# Patient Record
Sex: Female | Born: 1995 | Race: Black or African American | Hispanic: No | Marital: Single
Health system: Southern US, Community
[De-identification: ages and names within clinical notes are randomized; demographics above are authoritative.]

## PROBLEM LIST (undated history)

## (undated) DIAGNOSIS — F419 Anxiety disorder, unspecified: Secondary | ICD-10-CM

## (undated) DIAGNOSIS — F329 Major depressive disorder, single episode, unspecified: Secondary | ICD-10-CM

## (undated) DIAGNOSIS — J45909 Unspecified asthma, uncomplicated: Secondary | ICD-10-CM

## (undated) DIAGNOSIS — F32A Depression, unspecified: Secondary | ICD-10-CM

---
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
## 2019-02-17 NOTE — ED Notes (Signed)
ED Triage Note       ED Triage Adult Entered On:  02/17/2019 19:34 EDT    Performed On:  02/17/2019 19:31 EDT by Willeen Cass, RN, Rhys Martini               Triage   Chief Complaint :   sore throat, runny nose, chest discomfort, cough for the past two days   Numeric Rating Pain Scale :   8   [place] Mode of Arrival :   Walking   Infectious Disease Documentation :   Document assessment   Temperature Oral :   38.0 degC(Converted to: 100.4 degF)  (HI)    Heart Rate Monitored :   114 bpm (HI)    Respiratory Rate :   15 br/min   Systolic Blood Pressure :   143 mmHg (HI)    Diastolic Blood Pressure :   92 mmHg (HI)    SpO2 :   100 %   Oxygen Therapy :   Room air   Patient presentation :   None of the above   Chief Complaint or Presentation suggest infection :   Yes   Weight Dosing :   81.7 kg(Converted to: 180 lb 2 oz)    Height :   157.48 cm(Converted to: 5 ft 2 in)    Body Mass Index Dosing :   33 kg/m2   Almon Hercules F - 02/17/2019 19:31 EDT   DCP GENERIC CODE   Tracking Acuity :   3   Tracking Group :   ED [employer]acking [employer]   [place], RN, Rhys Martini - 02/17/2019 19:31 EDT   ED General Section :   Document assessment   Pregnancy Status :   Patient denies   Approximate Last Menstrual Period :   on menstrual   ED Allergies Section :   Document assessment   ED Reason for Visit Section :   Document assessment   ED Quick Assessment :   Patient appears awake, alert, oriented to baseline. Skin warm and dry. Moves all extremities. Respiration even and unlabored. Appears in no apparent distress.   Willeen Cass RN, Evette Georges F - 02/17/2019 19:31 EDT   ID Risk Screen Symptoms   Recent Travel History :   No recent travel   Close Contact with COVID-19  ID :   No   Willeen Cass RN, Evette Georges F - 02/17/2019 19:31 EDT   Allergies   (As Of: 02/17/2019 19:34:44 EDT)   Allergies (Active)   No Known Medication Allergies  Estimated Onset Date:   Unspecified ; Created By:   Marcellina Millin; Reaction Status:   Active ; Category:   Drug ; Substance:   No  Known Medication Allergies ; Type:   Allergy ; Updated By:   Marcellina Millin; Reviewed Date:   12/20/2015 19:25 EST        Psycho-Social   Last 3 mo, thoughts killing self/others :   Patient denies   ADHVIKA, HERSEY - 02/17/2019 19:31 EDT   ED Reason for Visit   (As Of: 02/17/2019 19:34:44 EDT)   Problems(Active)    Anxiety (IMO  :31517 )  Name of Problem:   Anxiety ; Recorder:   Marcellina Millin; Confirmation:   Confirmed ; Classification:   Medical ; Code:   [phone] ; Contributor System:   Dietitian ; Last Updated:   12/20/2015 19:07 EST ; Life Cycle Date:   12/20/2015 ; Life Cycle Status:   Active ; Vocabulary:  IMO        Asthma (SNOMED CT  :937169678 )  Name of Problem:   Asthma ; Recorder:   Marcellina Millin; Confirmation:   Confirmed ; Classification:   Medical ; Code:   [phone] ; Contributor System:   Dietitian ; Last Updated:   12/20/2015 19:07 EST ; Life Cycle Date:   12/20/2015 ; Life Cycle Status:   Active ; Vocabulary:   SNOMED CT          Diagnoses(Active)    UC - Sore Throat  Date:   02/17/2019 ; Diagnosis Type:   Reason For Visit ; Confirmation:   Complaint of ; Clinical Dx:   UC - Sore Throat ; Classification:   Medical ; Clinical Service:   Emergency medicine ; Code:   PNED ; Probability:   0 ; Diagnosis Code:   [id number]

## 2019-02-17 NOTE — ED Notes (Signed)
 ED Patient Summary       ;       [facility]  [address] [place], [place] [address]  [phone]  Discharge Instructions (Patient)  _______________________________________     Name: Wendy Gentry, Wendy Gentry  DOB: 1996-09-26                   MRN: [id number]                   FIN: [id number]  Reason For Visit: UC - Sore Throat;  COLD SX  Final Diagnosis: Upper respiratory tract infection     Visit Date: 02/17/2019 19:01:00  Address: [address] [place] [address]  Phone: [phone]     Primary Care Provider:      Name: PCP,  UNKNOWN      Phone:         Emergency Department Providers:        Primary Physician:   RAYMA HAMILTON [facility] would like to thank you for allowing us  to assist you with your healthcare needs. The following includes patient education materials and information regarding your injury/illness.     Follow-up Instructions: You were treated today on an emergency basis, it may be wise to contact your primary care provider to notify them of your visit today. You may have been referred to your regular doctor or a specialist, please follow up as instructed. If your condition worsens or you can't get in to see the doctor, contact the Emergency Department.              With: Address: When:   Follow up with primary care provider  Within 1 week   Comments:   Drink plenty fluids. Alternate Tylenol  and ibuprofen . No work tomorrow or the next day. Quarantine yourself until symptoms have improved. Follow-up with your doctor if further concern.              Printed Prescriptions:    Patient Education Materials:  Discharge Orders          Discharge Patient 02/17/19 20:20:00 EDT         Comment:      Upper Respiratory Infection, Adult, Easy-to-Read     Upper Respiratory Infection, Adult    Most upper respiratory infections (URIs) are caused by a virus. A URI affects the nose, throat, and upper air passages. The most common type of URI is  often called the common cold.      HOME CARE     Take medicines only as told by your doctor.     Gargle warm saltwater or take cough drops to comfort your throat as told by your doctor.      Use a warm mist humidifier or inhale steam from a shower to increase air moisture. This may make it easier to breathe.      Drink enough fluid to keep your pee (urine) clear or pale yellow.      Eat soups and other clear broths.     Have a healthy diet.      Rest as needed.      Go back to work when your fever is gone or your doctor says it is okay.     ? You may need to stay home longer to avoid giving your URI to others.    ? You can also wear a face mask and  wash your hands often to prevent spread of the virus.     Use your inhaler more if you have asthma.      Do not use any tobacco products, including cigarettes, chewing tobacco, or electronic cigarettes. If you need help quitting, ask your doctor.     GET HELP IF:     You are getting worse, not better.      Your symptoms are not helped by medicine.      You have chills.     You are getting more short of breath.     You have brown or red mucus.     You have yellow or brown discharge from your nose.     You have pain in your face, especially when you bend forward.     You have a fever.     You have puffy (swollen) neck glands.     You have pain while swallowing.     You have white areas in the back of your throat.     GET HELP RIGHT AWAY IF:     You have very bad or constant:    ? Headache.    ? Ear pain.    ? Pain in your forehead, behind your eyes, and over your cheekbones (sinus pain).    ? Chest pain.     You have long-lasting (chronic) lung disease and any of the following:    ? Wheezing.    ? Long-lasting cough.    ? Coughing up blood.    ? A change in your usual mucus.     You have a stiff neck.     You have changes in your:    ? Vision.    ? Hearing.    ? Thinking.    ? Mood.     MAKE SURE YOU:     Understand these instructions.     Will watch your condition.      Will get help right away if you are not doing well or get worse.    This information is not intended to replace advice given to you by your health care provider. Make sure you discuss any questions you have with your health care provider.    Document Released: 05/09/2008 Document Revised: 04/07/2015 Document Reviewed: 02/26/2014  Elsevier Interactive Patient Education ?2016 [employer]vier Inc.         Allergy Info: No Known Medication Allergies     Medication Information:  [facility] provided you with a complete list of medications post discharge, if you have been instructed to stop taking a medication please ensure you also follow up with this information to your Primary Care Physician.  Unless otherwise noted, patient will continue to take medications as prescribed prior to the Emergency Room visit.  Any specific questions regarding your chronic medications and dosages should be discussed with your physician(s) and pharmacist.          No Medications Documented      Medications Administered During Visit:              Medication Dose Route   ibuprofen  800 mg Oral          Major Tests and Procedures:  The following procedures and tests were performed during your Emergency Room visit.  COMMON PROCEDURES%>  COMMON PROCEDURES COMMENTS%>          Laboratory Orders  Name Status Details   Flu A B Completed Nasopharyngeal Swab, Stat, ST -  Stat, 02/17/19 19:40:00 EDT, 02/17/19 19:40:00 EDT, Nurse collect, BURNS-MD,  GLENDIA EWINGS, Print label Y/N               Radiology Orders  No radiology orders were placed.              Patient Care Orders  Name Status Details   Discharge Patient Ordered 02/17/19 20:20:00 EDT   ED Assessment Adult Completed 02/17/19 19:34:45 EDT, 02/17/19 19:34:45 EDT   ED Secondary Triage Completed 02/17/19 19:34:45 EDT, 02/17/19 19:34:45 EDT   ED Triage Adult Completed 02/17/19 19:01:25 EDT, 02/17/19 19:01:25 EDT        ---------------------------------------------------------------------------------------------------------------------  [facility] allows you to manage your health, view your test results, and retrieve your discharge documents from your hospital stay securely and conveniently from your computer.     To begin the enrollment process, visit [email or web address] Click on "Sign up now" under [employer].   Comment:

## 2019-02-17 NOTE — ED Notes (Signed)
ED Triage Note       ED Secondary Triage Entered On:  02/17/2019 20:18 EDT    Performed On:  02/17/2019 20:18 EDT by Willeen Cass, RN, Rhys Martini               General Information   Barriers to Learning :   None evident   ED Home Meds Section :   Document assessment   [facility] ED Fall Risk Section :   Document assessment   ED Advance Directives Section :   Document assessment   EDY, ATCHESON - 02/17/2019 20:18 EDT   (As Of: 02/17/2019 20:18:42 EDT)   Problems(Active)    Anxiety (IMO  :71062 )  Name of Problem:   Anxiety ; Recorder:   Marcellina Millin; Confirmation:   Confirmed ; Classification:   Medical ; Code:   [phone] ; Contributor System:   Dietitian ; Last Updated:   12/20/2015 19:07 EST ; Life Cycle Date:   12/20/2015 ; Life Cycle Status:   Active ; Vocabulary:   IMO        Asthma (SNOMED CT  :462703500 )  Name of Problem:   Asthma ; Recorder:   Marcellina Millin; Confirmation:   Confirmed ; Classification:   Medical ; Code:   [phone] ; Contributor System:   Dietitian ; Last Updated:   12/20/2015 19:07 EST ; Life Cycle Date:   12/20/2015 ; Life Cycle Status:   Active ; Vocabulary:   SNOMED CT          Diagnoses(Active)    UC - Sore Throat  Date:   02/17/2019 ; Diagnosis Type:   Reason For Visit ; Confirmation:   Complaint of ; Clinical Dx:   UC - Sore Throat ; Classification:   Medical ; Clinical Service:   Emergency medicine ; Code:   PNED ; Probability:   0 ; Diagnosis Code:   [id number]             -    Procedure History   (As Of: 02/17/2019 20:18:42 EDT)     Anesthesia Minutes:   0 ; Procedure Name:   None ; Procedure Minutes:   0            [facility] Fall Risk Assessment Tool   Hx of falling last 3 months ED Fall :   No   Patient confused or disoriented ED Fall :   No   Patient intoxicated or sedated ED Fall :   No   Patient impaired gait ED Fall :   No   Use a mobility assistance device ED Fall :   No   Patient altered elimination ED Fall :   No   [facility] ED Fall Score :   0     Willeen Cass RN, Evette Georges F - 02/17/2019 20:18 EDT   ED Advance Directive   Advance Directive :   No   Willeen Cass RN, Evette Georges F - 02/17/2019 20:18 EDT   Med Hx   Medication List   (As Of: 02/17/2019 20:18:42 EDT)   Normal Order    ibuprofen 800 mg Tab  :   ibuprofen 800 mg Tab ; Status:   Completed ; Ordered As Mnemonic:   Motrin ; Simple Display Line:   800 mg, 1 tabs, Oral, Once ; Ordering Provider:   Debera Lat; Catalog Code:   ibuprofen ; Order Dt/Tm:   02/17/2019 19:44:45 EDT

## 2019-02-17 NOTE — Discharge Summary (Signed)
ED Clinical Summary                     [facility]  [address]  [place], [place] [address]  [phone]          PERSON INFORMATION  Name: SARAHJO, POQUETTE Age:  23 Years DOB: 04-Feb-1996   Sex: Female Language: English PCP: PCP,  UNKNOWN   Marital Status: Single Phone: [phone] Med Service: MED-Medicine   MRN: [id number] Acct# [id number] Arrival: 02/17/2019 19:01:00   Visit Reason: UC - Sore Throat;  COLD SX Acuity: 3 LOS: 000 01:28   Address:    [address] [place] [address]   Diagnosis:    Upper respiratory tract infection  Medications:    Medications Administered During Visit:                Medication Dose Route   ibuprofen 800 mg Oral               Allergies      No Known Medication Allergies      Major Tests and Procedures:  The following procedures and tests were performed during your ED visit.  COMMON PROCEDURES%>  COMMON PROCEDURES COMMENTS%>                PROVIDER INFORMATION               Provider Role Assigned Don Broach [facility] ED Provider 02/17/2019 19:36:02    Willeen Cass, RN, Rhys Martini ED Nurse 02/17/2019 19:51:02        Attending Physician:  Debera Lat      Admit Doc  BURNS-MD,  Bartholomew Crews     Consulting Doc       VITALS INFORMATION  Vital Sign Triage Latest   Temp Oral ORAL_1%> ORAL%>   Temp Temporal TEMPORAL_1%> TEMPORAL%>   Temp Intravascular INTRAVASCULAR_1%> INTRAVASCULAR%>   Temp Axillary AXILLARY_1%> AXILLARY%>   Temp Rectal RECTAL_1%> RECTAL%>   02 Sat 100 % 100 %   Respiratory Rate RATE_1%> RATE%>   Peripheral Pulse Rate PULSE RATE_1%> PULSE RATE%>   Apical Heart Rate HEART RATE_1%> HEART RATE%>   Blood Pressure BLOOD PRESSURE_1%>/ BLOOD PRESSURE_1%>92 mmHg BLOOD PRESSURE%> / BLOOD PRESSURE%>92 mmHg                 Immunizations      No Immunizations Documented This Visit          DISCHARGE INFORMATION   Discharge Disposition: H Outpt-Sent Home   Discharge Location:  Home   Discharge Date and Time:  02/17/2019  20:29:20   ED Checkout Date and Time:  02/17/2019 20:29:20     DEPART REASON INCOMPLETE INFORMATION               Depart Action Incomplete Reason   Interactive View/I&O Recently assessed               Problems      Active           Anxiety          Asthma              Smoking Status      Never smoker         PATIENT EDUCATION INFORMATION  Instructions:     Upper Respiratory Infection, Adult, Easy-to-Read     Follow up:  With: Address: When:   Follow up with primary care provider  Within 1 week   Comments:   Drink plenty fluids. Alternate Tylenol and ibuprofen. No work tomorrow or the next day. Quarantine yourself until symptoms have improved. Follow-up with your doctor if further concern.              ED PROVIDER DOCUMENTATION     Patient:   LUCCIA, REINHEIMER             MRN: [id number]            FIN: [id number]               Age:   73 years     Sex:  Female     DOB:  [birth year]   Associated Diagnoses:   Upper respiratory tract infection   Author:   Debera Lat      Basic Information   Time seen: Provider Seen (ST)   ED Provider/Time:    Debera Lat / 02/17/2019 19:36  .   Additional information: Chief Complaint from Nursing Triage Note   Chief Complaint  Chief Complaint: sore throat, runny nose, chest discomfort, cough for the past two days (02/17/19 19:31:00).      History of Present Illness   The patient presents with sore throat and congestion, mild cough, body aches, fever  .  Patient is a 39 old African-American female who presents in the company of her child is also being seen today as a patient with somewhat similar concerns.  Patient is healthy.  She describes 1 day of body aches, congestion, sore throat, nasal congestion, headache, cough and malaise.  She did receive vaccine this year she believes.  No foreign travel however states that she was to [place] several weeks ago.  She has been here in this neighborhood for the last 10 days.  No definite exposure to  anyone presumed positive for coronavirus..        Review of Systems   Constitutional symptoms:  Negative except as documented in HPI.   Skin symptoms:  Negative except as documented in HPI.   Eye symptoms:  Negative except as documented in HPI.   ENMT symptoms:  Negative except as documented in HPI.   Respiratory symptoms:  Negative except as documented in HPI.   Cardiovascular symptoms:  Negative except as documented in HPI.   Gastrointestinal symptoms:  Negative except as documented in HPI.   Genitourinary symptoms   Musculoskeletal symptoms:  Negative except as documented in HPI.   Neurologic symptoms:  Negative except as documented in HPI.   Psychiatric symptoms:  Negative except as documented in HPI.   Endocrine symptoms:  Negative except as documented in HPI.   Hematologic/Lymphatic symptoms:  Negative except as documented in HPI.   Allergy/immunologic symptoms:  Negative except as documented in HPI.      Health Status   Allergies:    No inactive allergies have been recorded..      Past Medical/ Family/ Social History   Surgical history:    None ([id number])., Reviewed as documented in chart.   Family history: Reviewed as documented in chart.   Social history: Reviewed as documented in chart.   Problem list:    Active Problems (2)  Anxiety   Asthma   , per nurse's notes.      Physical Examination               Vital Signs   Vital  Signs   02/17/2019 19:31 EDT Systolic Blood Pressure 143 mmHg  HI    Diastolic Blood Pressure 92 mmHg  HI    Temperature Oral 38.0 degC  HI    Heart Rate Monitored 114 bpm  HI    Respiratory Rate 15 br/min    SpO2 100 %    Measurements   02/17/2019 19:34 EDT Body Mass Index est meas 32.94 kg/m2    Body Mass Index Measured 32.94 kg/m2   02/17/2019 19:31 EDT Height/Length Measured 157.48 cm    Weight Dosing 81.7 kg    Basic Oxygen Information   02/17/2019 19:31 EDT SpO2 100 %    Oxygen Therapy Room air    General:  Alert, no acute distress.    Skin:  Warm, dry, pink.    Head:  Normocephalic,  atraumatic.    Neck:  Supple, trachea midline, no tenderness.    Eye:  Pupils are equal, round and reactive to light, extraocular movements are intact, normal conjunctiva, vision grossly normal.    Ears, nose, mouth and throat:  Tympanic membranes clear, oral mucosa moist.    Cardiovascular:  Regular rate and rhythm, Normal peripheral perfusion.    Respiratory:  Lungs are clear to auscultation, respirations are non-labored.    Chest wall:  No tenderness, No deformity.    Back:  Nontender, Normal range of motion, Normal alignment.    Musculoskeletal:  Normal ROM, normal strength, no tenderness, no swelling, no deformity.    Gastrointestinal:  Soft, Nontender, Non distended, Normal bowel sounds.    Neurological:  Alert and oriented to person, place, time, and situation, No focal neurological deficit observed, normal sensory observed, normal motor observed.    Lymphatics:  No lymphadenopathy.   Psychiatric:  Cooperative, appropriate mood & affect, normal judgment.       Medical Decision Making   Notes:  Remains a stable, awake and alert.  Oxygenating well.  Clearly nontoxic-appearing.  Heart rate is down.  She appears to have a viral upper as disease, likely influenza.  I suspect a false negative study.  Will discharge home in stable condition.  Child has similar symptoms although is much more well-appearing.  Will discharge home in stable condition.  No concern for admission.  Well-appearing patient..      Reexamination/ Reevaluation   Vital signs   Basic Oxygen Information   02/17/2019 19:31 EDT SpO2 100 %    Oxygen Therapy Room air         Impression and Plan   Diagnosis   Upper respiratory tract infection (ICD10-CM J06.9, Discharge, Medical)   Plan   Condition: Improved.    Disposition: Discharged: to home.    Patient was given the following educational materials: Upper Respiratory Infection, Adult, Easy-to-Read.    Follow up with: Follow up with primary care provider Within 1 week Drink plenty fluids.  Alternate  Tylenol and ibuprofen.  No work tomorrow or the next day.  Quarantine yourself until symptoms have improved.  Follow-up with your doctor if further concern..    Counseled: Patient, Regarding diagnosis, Regarding diagnostic results.

## 2019-02-17 NOTE — ED Notes (Signed)
 ED Patient Education Note     Patient Education Materials Follows:  Easy-to-Read     Upper Respiratory Infection, Adult    Most upper respiratory infections (URIs) are caused by a virus. A URI affects the nose, throat, and upper air passages. The most common type of URI is often called "the common cold."      HOME CARE     Take medicines only as told by your doctor.     Gargle warm saltwater or take cough drops to comfort your throat as told by your doctor.      Use a warm mist humidifier or inhale steam from a shower to increase air moisture. This may make it easier to breathe.      Drink enough fluid to keep your pee (urine) clear or pale yellow.      Eat soups and other clear broths.     Have a healthy diet.      Rest as needed.      Go back to work when your fever is gone or your doctor says it is okay.     ? You may need to stay home longer to avoid giving your URI to others.    ? You can also wear a face mask and wash your hands often to prevent spread of the virus.     Use your inhaler more if you have asthma.      Do not use any tobacco products, including cigarettes, chewing tobacco, or electronic cigarettes. If you need help quitting, ask your doctor.     GET HELP IF:     You are getting worse, not better.      Your symptoms are not helped by medicine.      You have chills.     You are getting more short of breath.     You have brown or red mucus.     You have yellow or brown discharge from your nose.     You have pain in your face, especially when you bend forward.     You have a fever.     You have puffy (swollen) neck glands.     You have pain while swallowing.     You have white areas in the back of your throat.     GET HELP RIGHT AWAY IF:     You have very bad or constant:    ? Headache.    ? Ear pain.    ? Pain in your forehead, behind your eyes, and over your cheekbones (sinus pain).    ? Chest pain.     You have long-lasting (chronic) lung disease and any of the following:    ? Wheezing.     ? Long-lasting cough.    ? Coughing up blood.    ? A change in your usual mucus.     You have a stiff neck.     You have changes in your:    ? Vision.    ? Hearing.    ? Thinking.    ? Mood.     MAKE SURE YOU:     Understand these instructions.     Will watch your condition.     Will get help right away if you are not doing well or get worse.    This information is not intended to replace advice given to you by your health care provider. Make sure you discuss any questions you have with your health care  provider.    Document Released: 05/09/2008 Document Revised: 04/07/2015 Document Reviewed: 02/26/2014  Elsevier Interactive Patient Education ?2016 [employer]

## 2019-02-17 NOTE — ED Provider Notes (Signed)
UC - Sore Throat        Patient:   Wendy Gentry, Wendy Gentry             MRN: [id number]            FIN: [id number]               Age:   23 years     Sex:  Female     DOB:  [birth year]   Associated Diagnoses:   Upper respiratory tract infection   Author:   Debera Lat      Basic Information   Time seen: Provider Seen (ST)   ED Provider/Time:    Debera Lat / 02/17/2019 19:36  .   Additional information: Chief Complaint from Nursing Triage Note   Chief Complaint  Chief Complaint: sore throat, runny nose, chest discomfort, cough for the past two days (02/17/19 19:31:00).      History of Present Illness   The patient presents with sore throat and congestion, mild cough, body aches, fever  .  Patient is a 36 old African-American female who presents in the company of her child is also being seen today as a patient with somewhat similar concerns.  Patient is healthy.  She describes 1 day of body aches, congestion, sore throat, nasal congestion, headache, cough and malaise.  She did receive vaccine this year she believes.  No foreign travel however states that she was to [place] several weeks ago.  She has been here in this neighborhood for the last 10 days.  No definite exposure to anyone presumed positive for coronavirus..        Review of Systems   Constitutional symptoms:  Negative except as documented in HPI.   Skin symptoms:  Negative except as documented in HPI.   Eye symptoms:  Negative except as documented in HPI.   ENMT symptoms:  Negative except as documented in HPI.   Respiratory symptoms:  Negative except as documented in HPI.   Cardiovascular symptoms:  Negative except as documented in HPI.   Gastrointestinal symptoms:  Negative except as documented in HPI.   Genitourinary symptoms   Musculoskeletal symptoms:  Negative except as documented in HPI.   Neurologic symptoms:  Negative except as documented in HPI.   Psychiatric symptoms:  Negative except as documented in HPI.   Endocrine symptoms:   Negative except as documented in HPI.   Hematologic/Lymphatic symptoms:  Negative except as documented in HPI.   Allergy/immunologic symptoms:  Negative except as documented in HPI.      Health Status   Allergies:    No inactive allergies have been recorded..      Past Medical/ Family/ Social History   Surgical history:    None ([id number])., Reviewed as documented in chart.   Family history: Reviewed as documented in chart.   Social history: Reviewed as documented in chart.   Problem list:    Active Problems (2)  Anxiety   Asthma   , per nurse's notes.      Physical Examination               Vital Signs   Vital Signs   02/17/2019 19:31 EDT Systolic Blood Pressure 143 mmHg  HI    Diastolic Blood Pressure 92 mmHg  HI    Temperature Oral 38.0 degC  HI    Heart Rate Monitored 114 bpm  HI    Respiratory Rate 15 br/min    SpO2 100 %  Measurements   02/17/2019 19:34 EDT Body Mass Index est meas 32.94 kg/m2    Body Mass Index Measured 32.94 kg/m2   02/17/2019 19:31 EDT Height/Length Measured 157.48 cm    Weight Dosing 81.7 kg    Basic Oxygen Information   02/17/2019 19:31 EDT SpO2 100 %    Oxygen Therapy Room air    General:  Alert, no acute distress.    Skin:  Warm, dry, pink.    Head:  Normocephalic, atraumatic.    Neck:  Supple, trachea midline, no tenderness.    Eye:  Pupils are equal, round and reactive to light, extraocular movements are intact, normal conjunctiva, vision grossly normal.    Ears, nose, mouth and throat:  Tympanic membranes clear, oral mucosa moist.    Cardiovascular:  Regular rate and rhythm, Normal peripheral perfusion.    Respiratory:  Lungs are clear to auscultation, respirations are non-labored.    Chest wall:  No tenderness, No deformity.    Back:  Nontender, Normal range of motion, Normal alignment.    Musculoskeletal:  Normal ROM, normal strength, no tenderness, no swelling, no deformity.    Gastrointestinal:  Soft, Nontender, Non distended, Normal bowel sounds.    Neurological:  Alert and  oriented to person, place, time, and situation, No focal neurological deficit observed, normal sensory observed, normal motor observed.    Lymphatics:  No lymphadenopathy.   Psychiatric:  Cooperative, appropriate mood & affect, normal judgment.       Medical Decision Making   Notes:  Remains a stable, awake and alert.  Oxygenating well.  Clearly nontoxic-appearing.  Heart rate is down.  She appears to have a viral upper as disease, likely influenza.  I suspect a false negative study.  Will discharge home in stable condition.  Child has similar symptoms although is much more well-appearing.  Will discharge home in stable condition.  No concern for admission.  Well-appearing patient..      Reexamination/ Reevaluation   Vital signs   Basic Oxygen Information   02/17/2019 19:31 EDT SpO2 100 %    Oxygen Therapy Room air         Impression and Plan   Diagnosis   Upper respiratory tract infection (ICD10-CM J06.9, Discharge, Medical)   Plan   Condition: Improved.    Disposition: Discharged: to home.    Patient was given the following educational materials: Upper Respiratory Infection, Adult, Easy-to-Read.    Follow up with: Follow up with primary care provider Within 1 week Drink plenty fluids.  Alternate Tylenol and ibuprofen.  No work tomorrow or the next day.  Quarantine yourself until symptoms have improved.  Follow-up with your doctor if further concern..    Counseled: Patient, Regarding diagnosis, Regarding diagnostic results.    Signature Line     Electronically Signed on 02/17/2019 08:20 PM EDT   ________________________________________________   Debera Lat               Modified by: Debera Lat on 02/17/2019 08:20 PM EDT

## 2019-02-18 LAB — RAPID INFLUENZA A/B ANTIGENS
Lot/Kit Number: 705399
RAPID FLU TEST: NEGATIVE
Rapid Influenza A Ag: NEGATIVE
Rapid Influenza B Ag: NEGATIVE

## 2019-06-26 LAB — CBC WITH AUTO DIFFERENTIAL
Absolute Baso #: 0 10*3/uL (ref 0.0–0.2)
Absolute Eos #: 0.1 10*3/uL (ref 0.0–0.5)
Absolute Lymph #: 1.9 10*3/uL (ref 1.0–3.2)
Absolute Mono #: 0.6 10*3/uL (ref 0.3–1.0)
Basophils %: 0.3 % (ref 0.0–2.0)
Eosinophils %: 1 % (ref 0.0–7.0)
Hematocrit: 33.5 % — ABNORMAL LOW (ref 34.0–47.0)
Hemoglobin: 10.8 g/dL — ABNORMAL LOW (ref 11.5–15.7)
Immature Grans (Abs): 0.03 10*3/uL (ref 0.00–0.06)
Immature Granulocytes: 0.4 % (ref 0.1–0.6)
Lymphocytes: 26.7 % (ref 15.0–45.0)
MCH: 24.5 pg — ABNORMAL LOW (ref 27.0–34.5)
MCHC: 32.2 g/dL (ref 32.0–36.0)
MCV: 76 fL — ABNORMAL LOW (ref 81.0–99.0)
MPV: 11 fL (ref 7.2–13.2)
Monocytes: 8.5 % (ref 4.0–12.0)
NRBC Absolute: 0 10*3/uL (ref 0.000–0.012)
NRBC Automated: 0 % (ref 0.0–0.2)
Neutrophils %: 63.1 % (ref 42.0–74.0)
Neutrophils Absolute: 4.5 10*3/uL (ref 1.6–7.3)
Platelets: 329 10*3/uL (ref 140–440)
RBC: 4.41 x10e6/mcL (ref 3.60–5.20)
RDW: 15.6 % (ref 11.0–16.0)
WBC: 7.2 10*3/uL (ref 3.8–10.6)

## 2019-06-26 LAB — BASIC METABOLIC PANEL
Anion Gap: 14 mmol/L (ref 2–17)
BUN: 10 mg/dL (ref 6–20)
CO2: 22 mmol/L (ref 22–29)
Calcium: 9.3 mg/dL (ref 8.6–10.0)
Chloride: 102 mmol/L (ref 98–107)
Creatinine: 0.6 mg/dL (ref 0.5–0.9)
GFR African American: 152 mL/min/{1.73_m2} (ref >=90)
GFR Non-African American: 131 mL/min/{1.73_m2} (ref >=90)
Glucose: 85 mg/dL (ref 70–99)
OSMOLALITY CALCULATED: 274 mOsm/kg (ref 270–287)
Potassium: 3.7 mmol/L (ref 3.5–5.3)
Sodium: 138 mmol/L (ref 135–145)

## 2019-06-26 LAB — WET PREP, GENITAL
RBC, Wet Prep: >5 — AB
Trich, Wet Prep: ABSENT

## 2019-06-26 LAB — POC URINALYSIS, CHEMISTRY
Glucose, UA POC: NEGATIVE mg/dL
Ketones, Urine, POC: NEGATIVE mg/dL
Nitrate, UA POC: NEGATIVE
Protein, Urine, POC: 100 — AB
Specific Gravity, Urine, POC: >=1.03 (ref 1.003–1.035)
UROBILIN U POC: 1 EU/dL
pH, Urine, POC: 5.5 (ref 4.5–8.0)

## 2019-06-26 LAB — ABO/RH: ABO/Rh: O POS

## 2019-06-26 LAB — POC PREGNANCY UR-QUAL: Preg Test, Ur: POSITIVE

## 2019-06-26 LAB — HCG, QUANTITATIVE, PREGNANCY: hCG Quant: 272.6 m[IU]/mL — ABNORMAL HIGH (ref 0.0–5.3)

## 2019-06-26 NOTE — ED Notes (Signed)
ED Triage Note       ED Triage Adult Entered On:  06/26/2019 15:42 EDT    Performed On:  06/26/2019 15:39 EDT by Louretta Shorten H               Triage   Chief Complaint :   pt c/o abd cramping and spotting, she is pregnant but has not seen OB   Numeric Rating Pain Scale :   8   [place] Mode of Arrival :   Private vehicle   Infectious Disease Documentation :   Document assessment   Temperature Oral :   37.3 degC(Converted to: 99.1 degF)    Heart Rate Monitored :   96 bpm   Respiratory Rate :   18 br/min   Systolic Blood Pressure :   621 mmHg   Diastolic Blood Pressure :   83 mmHg   SpO2 :   100 %   Oxygen Therapy :   Room air   Patient presentation :   None of the above   Chief Complaint or Presentation suggest infection :   No   Weight Dosing :   76 kg(Converted to: 167 lb 9 oz)    Height :   157.5 cm(Converted to: 5 ft 2 in)    Body Mass Index Dosing :   31 kg/m2   Elly Modena - 06/26/2019 15:39 EDT   DCP GENERIC CODE   Tracking Acuity :   3   Tracking Group :   ED [id number] Tracking Group   Elly Modena - 06/26/2019 15:39 EDT   ED General Section :   Document assessment   Pregnancy Status :   Unconfirmed Pregnancy   Last Menstrual Period :   05/11/2019 EDT   ED Allergies Section :   Document assessment   ED Reason for Visit Section :   Document assessment   Elly Modena - 06/26/2019 15:39 EDT   ID Risk Screen Symptoms   Recent Travel History :   No recent travel   Close Contact with COVID-19 ID :   No   Last 14 days COVID-19 ID :   No   TB Symptom Screen :   No symptoms   C. diff Symptom/History ID :   Neither of the above   Elly Modena - 06/26/2019 15:39 EDT   Allergies   (As Of: 06/26/2019 15:42:57 EDT)   Allergies (Active)   No Known Medication Allergies  Estimated Onset Date:   Unspecified ; Created By:   Tori Milks; Reaction Status:   Active ; Category:   Drug ; Substance:   No Known Medication Allergies ; Type:   Allergy ; Updated By:   Tori Milks; Reviewed Date:   12/20/2015 19:25 EST         Psycho-Social   Last 3 mo, thoughts killing self/others :   Patient denies   Elly Modena - 06/26/2019 15:39 EDT   ED Reason for Visit   (As Of: 06/26/2019 15:42:57 EDT)   Problems(Active)    Anxiety (IMO  :[id number] )  Name of Problem:   Anxiety ; Recorder:   Tori Milks; Confirmation:   Confirmed ; Classification:   Medical ; Code:   [phone] ; Contributor System:   Conservation officer, nature ; Last Updated:   12/20/2015 19:07 EST ; Life Cycle Date:   12/20/2015 ; Life Cycle Status:   Active ; Vocabulary:   IMO  Asthma (SNOMED CT  :[id number] )  Name of Problem:   Asthma ; Recorder:   Marcellina MillinStandifer,  Marian; Confirmation:   Confirmed ; Classification:   Medical ; Code:   [id number] ; Contributor System:   DietitianowerChart ; Last Updated:   12/20/2015 19:07 EST ; Life Cycle Date:   12/20/2015 ; Life Cycle Status:   Active ; Vocabulary:   SNOMED CT          Diagnoses(Active)    Vaginal bleeding - < [redacted] wks pregnant  Date:   06/26/2019 ; Diagnosis Type:   Reason For Visit ; Confirmation:   Complaint of ; Clinical Dx:   Vaginal bleeding - < [redacted] wks pregnant ; Classification:   Medical ; Clinical Service:   Emergency medicine ; Code:   PNED ; Probability:   0 ; Diagnosis Code:   [id number]

## 2019-06-26 NOTE — ED Notes (Signed)
 ED Results Callback Log     Genital Chlamydia Trachomatis  - - Pos  06/28/2019 11:00      06/29/2019 11:11 Dru, RN, Roberta L) No further action required Dr. Ila Chag ordered Azithromycin 250mg  PO, 4 tablets, 1 x dose. no refills. (RB/V Sandrea Dover, RN).Called pt, left v/m to return call for test results. Needs RX and STD instructions.        06/28/2019 14:34 Dru, RN, Sandrea CROME) Provider Review Required positive chlamydia culture. provider review.        Addendum by Dover RN, Sandrea CROME on June 30, 2019 9:43 EDT               No return call. letter sent.

## 2019-06-26 NOTE — Discharge Summary (Signed)
ED Clinical Summary                     [facility]  [address] [place] [address]  [id number]          PERSON INFORMATION  Name: Wendy Gentry, Wendy Gentry Age:  23 Years DOB: 07-Aug-1996   Sex: Female Language: English PCP: PCP,  UNKNOWN   Marital Status: Single Phone: [phone] Med Service: MED-Medicine   MRN: [id number] Acct# [id number] Arrival: 06/26/2019 15:32:00   Visit Reason: Vaginal bleeding - < [redacted] wks pregnant; POSS MISCARRIAGE Acuity: 3 LOS: 000 02:46   Address:    [place] [address]   Diagnosis:    Threatened miscarriage  Medications:    Medications Administered During Visit:              Allergies      No Known Medication Allergies      Major Tests and Procedures:  The following procedures and tests were performed during your ED visit.  COMMON PROCEDURES%>  COMMON PROCEDURES COMMENTS%>                PROVIDER INFORMATION               Provider Role Assigned Joanie Coddington ED MidLevel 06/26/2019 16:07:50    Britta Mccreedy RN, Garvin Fila ED Nurse 06/26/2019 16:08:50        Attending Physician:  MAFFEO-PA-C,  HEATHER N      Admit Doc  MAFFEO-PA-C,  HEATHER N     Consulting Doc       VITALS INFORMATION  Vital Sign Triage Latest   Temp Oral ORAL_1%> ORAL%>   Temp Temporal TEMPORAL_1%> TEMPORAL%>   Temp Intravascular INTRAVASCULAR_1%> INTRAVASCULAR%>   Temp Axillary AXILLARY_1%> AXILLARY%>   Temp Rectal RECTAL_1%> RECTAL%>   02 Sat 100 % 100 %   Respiratory Rate RATE_1%> RATE%>   Peripheral Pulse Rate PULSE RATE_1%> PULSE RATE%>   Apical Heart Rate HEART RATE_1%> HEART RATE%>   Blood Pressure BLOOD PRESSURE_1%>/ BLOOD PRESSURE_1%>83 mmHg BLOOD PRESSURE%> / BLOOD PRESSURE%>79 mmHg                 Immunizations      No Immunizations Documented This Visit          DISCHARGE INFORMATION   Discharge Disposition: H Outpt-Sent Home   Discharge Location:  Home   Discharge Date and Time:  06/26/2019 18:18:17   ED Checkout Date and Time:  06/26/2019  18:18:17     DEPART REASON INCOMPLETE INFORMATION               Depart Action Incomplete Reason   Interactive View/I&O Recently assessed               Problems      Active           Anxiety          Asthma              Smoking Status      Never smoker         PATIENT EDUCATION INFORMATION  Instructions:     Vaginal Bleeding During Pregnancy, First Trimester, Easy-to-Read; Threatened Miscarriage, Easy-to-Read     Follow up:                   With: Address: When:   Foy [place], Physician - OB/GYN [address] [place] [address]  [phone] Business (1) Within 1 to 2 days   Comments:   Take over-the-counter Tylenol as directed for pain   Follow-up with OB/GYN, call first thing tomorrow for appointment for repeat ultrasound, beta quantitative level in the next few days  Follow up with your primary care doctor  Return to the emergency department for any new, worsening or concerning symptoms                ED PROVIDER DOCUMENTATION     Patient:   Wendy Gentry, Wendy Gentry             MRN: [id number]            FIN: [id number]               Age:   23 years     Sex:  Female     DOB:  11/11/1996   Associated Diagnoses:   Threatened miscarriage   Author:   Vergia AlbertsMAFFEO-PA-C,  HEATHER N      Basic Information   Time seen: Provider Seen (ST)   ED Provider/Time:    MAFFEO-PA-C,  HEATHER N / 06/26/2019 16:07  .   Additional information: Chief Complaint from Nursing Triage Note   Chief Complaint  Chief Complaint: pt c/o abd cramping and spotting, she is pregnant but has not seen OB (06/26/19 15:39:00).      History of Present Illness   Patient is a 23 year old G2, P1 approximately [redacted] weeks gestation by dates female who presents emergency department with complaints of abdominal cramping, vaginal spotting.  Patient states she first noticed the symptoms today.  She states she has not yet established with an OB/GYN in this area because [place].  She denies any nausea, vomiting, fever,  chills.  She has no other complaints at this time..        Review of Systems   Constitutional symptoms:  No fever, no chills.    Eye symptoms:  Vision unchanged, No diplopia,    ENMT symptoms:  No ear pain, no sore throat.    Respiratory symptoms:  No shortness of breath, no cough.    Cardiovascular symptoms:  No chest pain, no palpitations.    Gastrointestinal symptoms:  Abdominal pain, no vomiting, no diarrhea.    Genitourinary symptoms:  Vaginal bleeding, no dysuria, no hematuria.    Neurologic symptoms:  No numbness, no tingling, no focal weakness.              Additional review of systems information: All other systems reviewed and otherwise negative.      Health Status   Allergies:    Allergic Reactions (Selected)  No Known Medication Allergies.   Medications:  (Selected)   .      Past Medical/ Family/ Social History   Medical history:    No active or resolved past medical history items have been selected or recorded., Reviewed as documented in chart.   Surgical history:    None ([id number])., Reviewed as documented in chart.   Family history: Reviewed as documented in chart.   Social history: Reviewed as documented in chart.   Problem list:    Active Problems (2)  Anxiety   Asthma   , per nurse's notes.      Physical Examination               Vital Signs   Vital Signs   06/26/2019 16:29 EDT Systolic Blood Pressure 126 mmHg  Diastolic Blood Pressure 79 mmHg    Heart Rate Monitored 98 bpm    Respiratory Rate 16 br/min    SpO2 100 %   06/26/2019 15:39 EDT Systolic Blood Pressure 128 mmHg    Diastolic Blood Pressure 83 mmHg    Temperature Oral 37.3 degC    Heart Rate Monitored 96 bpm    Respiratory Rate 18 br/min    SpO2 100 %   .   Measurements   06/26/2019 15:42 EDT Body Mass Index est meas 30.64 kg/m2   06/26/2019 15:42 EDT Body Mass Index Measured 30.64 kg/m2   06/26/2019 15:39 EDT Height/Length Measured 157.5 cm    Weight Dosing 76 kg   .   General:  Alert, no acute distress, Not ill-appearing,    Skin:  Warm,  dry.    Head:  Normocephalic.   Neck:  Supple.   Eye:  Normal conjunctiva, vision grossly normal.    Ears, nose, mouth and throat:  Oral mucosa moist.   Cardiovascular:  Regular rate and rhythm, No murmur, Normal peripheral perfusion, No edema.    Respiratory:  Lungs are clear to auscultation, respirations are non-labored, breath sounds are equal, Symmetrical chest wall expansion.    Chest wall:  No deformity.   Back:  Normal range of motion.   Musculoskeletal:  Normal ROM, normal strength.    Gastrointestinal:  Soft, Nontender, Non distended.    Genitourinary:  External genitalia: Normal, Speculum exam: Bleeding, Bimanual exam: Tenderness.    Neurological:  Normal motor observed, normal speech observed.    Psychiatric:  Appropriate mood & affect.      Medical Decision Making   Differential Diagnosis:  Ectopic pregnancy, threatened abortion, spontaneous abortion, incomplete abortion, urinary tract infection.    Documents reviewed:  Emergency department nurses' notes.   Radiology results:  Rad Results (ST)   US Pregnancy Transvaginal  ?  06/26/19 17:26:16  OB ultrasound, transvaginal: 06/26/19    INDICATION:Pregnancy related conditions, unspecified, unspecified trimester.    COMPARISON: None    TECHNIQUE: Transvaginal grayscale and color Doppler images of the uterus and  adnexae.    FINDINGS:  Uterus: 7.4 x 4.4 x 5.0 cm. The uterus is noted to be retroverted. Small amount  of fluid in the cervical canal.  Adnexae: Right ovary 4.0 x 1.8 x 3.2 cm. Left ovary 3.8 x 2.1 x 2.9 cm. Both  ovaries have an unremarkable sonographic appearance.  Fluid noted in the cul-de-sac.    There is a small cystic area in the endometrium which may reflect a small  gestational sac, measuring 0.4 x 0.2 x 0.3 cm. No fetal pole identified. There  is decidual reaction with a gestational sac present.  Gestational sac size: 0.4 x 0.2 x 0.3 cm.  Ultrasound gestational age based on gestational sac size: 5 weeks 0 days  Clinical gestational age: 75  weeks 4 days by LMP    IMPRESSION:  Possible gestational sac identified with no fetal pole visualized. Trace fluid  in the cervical canal. Pelvic free fluid noted in the cul-de-sac. No pelvic  ectopic pregnancy identified. Differential considerations include early IUP  versus failed IUP. Serial beta hCG with followup ultrasound recommended.  EGA by sac size, 5 weeks, 0 days.  ?  Signed By: Precious HawsBARNES-MD, ANDREW F  .   Notes:  Patient in the emergency department for evaluation of abdominal cramping, vaginal spotting in early pregnancy.  Beta quantitative level in the 200s.  Vaginal ultrasound reveals gestational sac smaller than dates with  no fetal pole.  Discussed this at length with the patient.  She was advised to call OB/GYN first thing tomorrow for follow-up appointment with repeat beta quantitative level and ultrasound, and return immediately to the emergency department for any new or worsening symptoms.  Did discuss miscarriage symptoms with the patient and explained to that worsening abdominal pain or vaginal bleeding may be normal but to return for any concerns.  Patient is in agreement with this plan of care. They express understanding and have no further questions at this time. Patient is afebrile, nontoxic appearing and in no acute distress. Discharged to home in stable condition..      Impression and Plan   Diagnosis   Threatened miscarriage (ICD10-CM O20.0, Discharge, Medical)   Plan   Condition: Improved.    Disposition: Discharged: Time  06/26/2019 18:07:00, to home.    Patient was given the following educational materials: Threatened Miscarriage, Easy-to-Read, Vaginal Bleeding During Pregnancy, First Trimester, Easy-to-Read.    Follow up with: DENISE DEVINE-MD, Physician - OB/GYN Within 1 to 2 days Take over-the-counter Tylenol as directed for pain  Follow-up with OB/GYN, call first thing tomorrow for appointment for repeat ultrasound, beta quantitative level in the next few days  Follow up with your  primary care doctor  Return to the emergency department for any new, worsening or concerning symptoms   .    Counseled: I had a detailed discussion with the patient and/or guardian regarding the historical points/exam findings supporting the discharge diagnosis and need for outpatient followup. Discussed the need to return to the ER if symptoms persist/worsen, or for any questions/concerns that arise at home.

## 2019-06-26 NOTE — ED Notes (Signed)
ED Triage Note       ED Secondary Triage Entered On:  06/26/2019 16:20 EDT    Performed On:  06/26/2019 16:20 EDT by Britta Mccreedy, RN, Garvin Fila               General Information   Barriers to Learning :   None evident   ED Home Meds Section :   Document assessment   [facility] ED Fall Risk Section :   Document assessment   ED Advance Directives Section :   Document assessment   ED Palliative Screen :   N/A (prefilled for <65yo)   Bonnita Levan - 06/26/2019 16:20 EDT   (As Of: 06/26/2019 16:20:28 EDT)   Problems(Active)    Anxiety (IMO  :18299 )  Name of Problem:   Anxiety ; Recorder:   Tori Milks; Confirmation:   Confirmed ; Classification:   Medical ; Code:   [phone] ; Contributor System:   Conservation officer, nature ; Last Updated:   12/20/2015 19:07 EST ; Life Cycle Date:   12/20/2015 ; Life Cycle Status:   Active ; Vocabulary:   IMO        Asthma (SNOMED CT  :[id number] )  Name of Problem:   Asthma ; Recorder:   Tori Milks; Confirmation:   Confirmed ; Classification:   Medical ; Code:   [id number] ; Contributor System:   Conservation officer, nature ; Last Updated:   12/20/2015 19:07 EST ; Life Cycle Date:   12/20/2015 ; Life Cycle Status:   Active ; Vocabulary:   SNOMED CT          Diagnoses(Active)    Vaginal bleeding - < [redacted] wks pregnant  Date:   06/26/2019 ; Diagnosis Type:   Reason For Visit ; Confirmation:   Complaint of ; Clinical Dx:   Vaginal bleeding - < [redacted] wks pregnant ; Classification:   Medical ; Clinical Service:   Emergency medicine ; Code:   PNED ; Probability:   0 ; Diagnosis Code:   [id number]             -    Procedure History   (As Of: 06/26/2019 16:20:28 EDT)     Anesthesia Minutes:   0 ; Procedure Name:   None ; Procedure Minutes:   0            [facility] Fall Risk Assessment Tool   Hx of falling last 3 months ED Fall :   No   Patient confused or disoriented ED Fall :   No   Patient intoxicated or sedated ED Fall :   No   Patient impaired gait ED Fall :   No   Use a mobility assistance device ED Fall :    No   Patient altered elimination ED Fall :   No   [facility] ED Fall Score :   0    Bonnita Levan - 06/26/2019 16:20 EDT   ED Advance Directive   Advance Directive :   No   Britta Mccreedy RNGarvin Fila - 06/26/2019 16:20 EDT   Med Hx   Medication List   (As Of: 06/26/2019 16:20:28 EDT)   No Known Home Medications     Bonnita Levan - 06/26/2019 16:20:25

## 2019-06-26 NOTE — ED Notes (Signed)
 ED Patient Summary       ;       [facility] Emergency Department  [address], [place] [address]  [phone]  Discharge Instructions (Patient)  _______________________________________     Name: Wendy Gentry, Wendy Gentry  DOB: Jan 25, 1996                   MRN: [id number]                   FIN: [id number]  Reason For Visit: Vaginal bleeding - < [redacted] wks pregnant; POSS MISCARRIAGE  Final Diagnosis: Threatened miscarriage     Visit Date: 06/26/2019 15:32:00  Address: [address] [place] [address]  Phone: [phone]     Primary Care Provider:      Name: PCP,  UNKNOWN      Phone:         Emergency Department Providers:         Primary Physician:            Shelvy Gwenn Salvage would like to thank you for allowing us  to assist you with your healthcare needs. The following includes patient education materials and information regarding your injury/illness.     Follow-up Instructions: You were treated today on an emergency basis, it may be wise to contact your primary care provider to notify them of your visit today. You may have been referred to your regular doctor or a specialist, please follow up as instructed. If your condition worsens or you can't get in to see the doctor, contact the Emergency Department.              With: Address: When:   DENISE DEVINE-MD, Physician - OB/GYN [address] [place], [place] [address]  [phone] Business (1) Within 1 to 2 days   Comments:   Take over-the-counter Tylenol  as directed for pain   Follow-up with OB/GYN, call first thing tomorrow for appointment for repeat ultrasound, beta quantitative level in the next few days  Follow up with your primary care doctor  Return to the emergency department for any new, worsening or concerning symptoms                Printed Prescriptions:    Patient Education Materials:  Discharge Orders          Discharge Patient 06/26/19 18:08:00 EDT         Comment:      Vaginal Bleeding During Pregnancy, First Trimester,  Easy-to-Read; Threatened Miscarriage, Easy-to-Read     Vaginal Bleeding During Pregnancy, First Trimester    A small amount of bleeding (spotting) from the vagina is common in early pregnancy. Sometimes the bleeding is normal and is not a problem, and sometimes it is a sign of something serious. Be sure to tell your doctor about any bleeding from your vagina right away.      HOME CARE     Watch your condition for any changes.     Follow your doctor's instructions about how active you can be.      If you are on bed rest:    ? You may need to stay in bed and only get up to use the bathroom.    ? You may be allowed to do some activities.    ? If you need help, make plans for someone to help you.     Write down:    ? The number of pads you use each day.    ?  How often you change pads.    ? How soaked (saturated) your pads are.      Do not use tampons.     Do not douche.      Do not have sex or orgasms until your doctor says it is okay.     If you pass any tissue from your vagina, save the tissue so you can show it to your doctor.     Only take medicines as told by your doctor.      Do not take aspirin because it can make you bleed.     Keep all follow-up visits as told by your doctor.    GET HELP IF:     You bleed from your vagina.     You have cramps.     You have labor pains.      You have a fever that does not go away after you take medicine.     GET HELP RIGHT AWAY IF:     You have very bad cramps in your back or belly (abdomen).     You pass large clots or tissue from your vagina.      You bleed more.     You feel light-headed or weak.     You pass out (faint).      You have chills.     You are leaking fluid or have a gush of fluid from your vagina.     You pass out while pooping (having a bowel movement).     MAKE SURE YOU:     Understand these instructions.     Will watch your condition.     Will get help right away if you are not doing well or get worse.    This information is not intended to replace advice  given to you by your health care provider. Make sure you discuss any questions you have with your health care provider.    Document Released: 04/07/2014 Document Reviewed: 04/07/2014  Elsevier Interactive Patient Education ?2016 Elsevier Inc.       Threatened Miscarriage    A threatened miscarriage is when you have vaginal bleeding during your first 20 weeks of pregnancy but the pregnancy has not ended. Your doctor will do tests to make sure you are still pregnant. The cause of the bleeding may not be known. This condition does not mean your pregnancy will end. It does increase the risk of it ending (complete miscarriage).      HOME CARE     Make sure you keep all your doctor visits for prenatal care.     Get plenty of rest.     Do not have sex or use tampons if you have vaginal bleeding.     Do not douche.     Do not smoke or use drugs.     Do not drink alcohol.     Avoid caffeine.    GET HELP IF:     You have light bleeding from your vagina.     You have belly pain or cramping.      You have a fever.    GET HELP RIGHT AWAY IF:     You have heavy bleeding from your vagina.      You have clots of blood coming from your vagina.     You have bad pain or cramps in your low back or belly.     You have fever, chills, and bad belly pain.  MAKE SURE YOU:     Understand these instructions.     Will watch your condition.     Will get help right away if you are not doing well or get worse.    This information is not intended to replace advice given to you by your health care provider. Make sure you discuss any questions you have with your health care provider.    Document Released: 11/03/2008 Document Revised: 11/26/2013 Document Reviewed: 09/17/2013  Elsevier Interactive Patient Education ?2016 Elsevier Inc.         Allergy Info: No Known Medication Allergies     Medication Information:  [facility] ED Physicians provided you with a complete list of medications post discharge, if you have been instructed to stop  taking a medication please ensure you also follow up with this information to your Primary Care Physician.  Unless otherwise noted, patient will continue to take medications as prescribed prior to the Emergency Room visit.  Any specific questions regarding your chronic medications and dosages should be discussed with your physician(s) and pharmacist.          No Known Home Medications      Medications Administered During Visit:       Major Tests and Procedures:  The following procedures and tests were performed during your ED visit.  COMMON PROCEDURES%>  COMMON PROCEDURES COMMENTS%>          Laboratory Orders  Name Status Details   .Preg U POC Completed Urine, RT, RT - Routine, Collected, 06/26/19 16:31:00 EDT, Nurse collect, 06/26/19 16:31:00 US Robinette, RAL POC Login   .UA POC Completed Urine, RT, RT - Routine, Collected, 06/26/19 16:29:00 EDT, Nurse collect, 06/26/19 16:29:00 US Robinette, RAL POC Login   BMP Ordered Blood, Stat, ST - Stat, 06/26/19 16:19:00 EDT, 06/26/19 16:19:00 EDT, Nurse collect, MAFFEO-PA-C,  HEATHER N, Print label Y/N   CBCDIFF Completed Blood, Stat, ST - Stat, 06/26/19 16:19:00 EDT, 06/26/19 16:19:00 EDT, Nurse collect, MAFFEO-PA-C,  HEATHER N, Print label Y/N   CT GC Genital Ordered Genital, Stat, ST - Stat, 06/26/19 16:19:00 EDT, Once, 06/26/19 16:19:00 EDT, Nurse collect   EABORh Completed Blood, Stat, ST - Stat, Collected, 06/26/19 16:23:00 EDT, Nurse collect, 06/26/19 16:23:00 EDT, [id number]   HCG QT Completed Blood, Stat, ST - Stat, 06/26/19 16:19:00 EDT, 06/26/19 16:19:00 EDT, Nurse collect, MAFFEO-PA-C,  HEATHER N, Print label Y/N   KOH Ordered Vaginal, Stat, ST - Stat, 06/26/19 16:19:00 EDT, Once, 06/26/19 16:19:00 EDT, Nurse collect   PABORh Completed Blood, Stat, ST - Stat, 06/26/19 16:19:00 EDT, Nurse collect, Print label Y/N   Wet Prep Ordered Vaginal, Stat, ST - Stat, 06/26/19 16:19:00 EDT, 06/26/19 16:19:00 EDT, Nurse collect, MAFFEO-PA-C,  HEATHER N, Print label Y/N                Radiology Orders  Name Status Details   US  Pregnancy Transvaginal Completed 06/26/19 16:19:00 EDT, STAT 1 hour or less, Reason: Pregnancy related conditions, unspecified, unspecified trimester, Transport Mode: STRETCHER, pp_set_radiology_subspecialty               Patient Care Orders  Name Status Details   Discharge Patient Ordered 06/26/19 18:08:00 EDT   ED Assessment Adult Completed 06/26/19 15:42:58 EDT, 06/26/19 15:42:58 EDT   ED Secondary Triage Completed 06/26/19 15:42:58 EDT, 06/26/19 15:42:58 EDT   ED Triage Adult Completed 06/26/19 15:32:34 EDT, 06/26/19 15:32:34 EDT   POC-Urine Dipstick collect Completed 06/26/19 16:19:00 EDT, Once, 06/26/19 16:19:00 EDT   POC-Urine Pregnancy Test collect Completed 06/26/19  16:19:00 EDT, Once, 06/26/19 16:19:00 EDT   Pelvic Exam Setup Completed 06/26/19 16:19:00 EDT, Once, 06/26/19 16:19:00 EDT       ---------------------------------------------------------------------------------------------------------------------  Florie Shelvy [facility]) encourages you to self-enroll in the [facility] Patient Portal.  [facility] Patient Portal will allow you to manage your personal health information securely from your own electronic device now and in the future.  To begin your Patient Portal enrollment process, please visit [email or web address] Click on "Sign up now" under [facility].  If you find that you need additional assistance on the [facility] Patient Portal or need a copy of your medical records, please call the [facility] Medical Records Office at [phone].  Comment:

## 2019-06-26 NOTE — ED Provider Notes (Signed)
Pregnancy Problem < 20 Weeks *ED        Patient:   Wendy Gentry, Wendy Gentry             MRN: [id number]            FIN: [id number]               Age:   23 years     Sex:  Female     DOB:  01/03/1996   Associated Diagnoses:   Threatened miscarriage   Author:   Lavena Stanford      Basic Information   Time seen: Provider Seen (ST)   ED Provider/Time:    Leightyn Cina-PA-C,  Peniel Biel N / 06/26/2019 16:07  .   Additional information: Chief Complaint from Nursing Triage Note   Chief Complaint  Chief Complaint: pt c/o abd cramping and spotting, she is pregnant but has not seen OB (06/26/19 15:39:00).      History of Present Illness   Patient is a 23 year old G2, P1 approximately [redacted] weeks gestation by dates female who presents emergency department with complaints of abdominal cramping, vaginal spotting.  Patient states she first noticed the symptoms today.  She states she has not yet established with an OB/GYN in this area because she moved here from [place] approximately 5 months ago.  She denies any nausea, vomiting, fever, chills.  She has no other complaints at this time..        Review of Systems   Constitutional symptoms:  No fever, no chills.    Eye symptoms:  Vision unchanged, No diplopia,    ENMT symptoms:  No ear pain, no sore throat.    Respiratory symptoms:  No shortness of breath, no cough.    Cardiovascular symptoms:  No chest pain, no palpitations.    Gastrointestinal symptoms:  Abdominal pain, no vomiting, no diarrhea.    Genitourinary symptoms:  Vaginal bleeding, no dysuria, no hematuria.    Neurologic symptoms:  No numbness, no tingling, no focal weakness.              Additional review of systems information: All other systems reviewed and otherwise negative.      Health Status   Allergies:    Allergic Reactions (Selected)  No Known Medication Allergies.   Medications:  (Selected)   .      Past Medical/ Family/ Social History   Medical history:    No active or resolved past medical history items have been  selected or recorded., Reviewed as documented in chart.   Surgical history:    None ([id number])., Reviewed as documented in chart.   Family history: Reviewed as documented in chart.   Social history: Reviewed as documented in chart.   Problem list:    Active Problems (2)  Anxiety   Asthma   , per nurse's notes.      Physical Examination               Vital Signs   Vital Signs   6/38/7564 33:29 EDT Systolic Blood Pressure 518 mmHg    Diastolic Blood Pressure 79 mmHg    Heart Rate Monitored 98 bpm    Respiratory Rate 16 br/min    SpO2 100 %   8/41/6606 30:16 EDT Systolic Blood Pressure 010 mmHg    Diastolic Blood Pressure 83 mmHg    Temperature Oral 37.3 degC    Heart Rate Monitored 96 bpm    Respiratory Rate 18 br/min    SpO2 100 %   .  Measurements   06/26/2019 15:42 EDT Body Mass Index est meas 30.64 kg/m2   06/26/2019 15:42 EDT Body Mass Index Measured 30.64 kg/m2   06/26/2019 15:39 EDT Height/Length Measured 157.5 cm    Weight Dosing 76 kg   .   General:  Alert, no acute distress, Not ill-appearing,    Skin:  Warm, dry.    Head:  Normocephalic.   Neck:  Supple.   Eye:  Normal conjunctiva, vision grossly normal.    Ears, nose, mouth and throat:  Oral mucosa moist.   Cardiovascular:  Regular rate and rhythm, No murmur, Normal peripheral perfusion, No edema.    Respiratory:  Lungs are clear to auscultation, respirations are non-labored, breath sounds are equal, Symmetrical chest wall expansion.    Chest wall:  No deformity.   Back:  Normal range of motion.   Musculoskeletal:  Normal ROM, normal strength.    Gastrointestinal:  Soft, Nontender, Non distended.    Genitourinary:  External genitalia: Normal, Speculum exam: Bleeding, Bimanual exam: Tenderness.    Neurological:  Normal motor observed, normal speech observed.    Psychiatric:  Appropriate mood & affect.      Medical Decision Making   Differential Diagnosis:  Ectopic pregnancy, threatened abortion, spontaneous abortion, incomplete abortion, urinary tract  infection.    Documents reviewed:  Emergency department nurses' notes.   Radiology results:  Rad Results (ST)   US Pregnancy Transvaginal  ?  06/26/19 17:26:16  OB ultrasound, transvaginal: 06/26/19    INDICATION:Pregnancy related conditions, unspecified, unspecified trimester.    COMPARISON: None    TECHNIQUE: Transvaginal grayscale and color Doppler images of the uterus and  adnexae.    FINDINGS:  Uterus: 7.4 x 4.4 x 5.0 cm. The uterus is noted to be retroverted. Small amount  of fluid in the cervical canal.  Adnexae: Right ovary 4.0 x 1.8 x 3.2 cm. Left ovary 3.8 x 2.1 x 2.9 cm. Both  ovaries have an unremarkable sonographic appearance.  Fluid noted in the cul-de-sac.    There is a small cystic area in the endometrium which may reflect a small  gestational sac, measuring 0.4 x 0.2 x 0.3 cm. No fetal pole identified. There  is decidual reaction with a gestational sac present.  Gestational sac size: 0.4 x 0.2 x 0.3 cm.  Ultrasound gestational age based on gestational sac size: 5 weeks 0 days  Clinical gestational age: 18 weeks 4 days by LMP    IMPRESSION:  Possible gestational sac identified with no fetal pole visualized. Trace fluid  in the cervical canal. Pelvic free fluid noted in the cul-de-sac. No pelvic  ectopic pregnancy identified. Differential considerations include early IUP  versus failed IUP. Serial beta hCG with followup ultrasound recommended.  EGA by sac size, 5 weeks, 0 days.  ?  Signed By: Precious HawsBARNES-MD, ANDREW F  .   Notes:  Patient in the emergency department for evaluation of abdominal cramping, vaginal spotting in early pregnancy.  Beta quantitative level in the 200s.  Vaginal ultrasound reveals gestational sac smaller than dates with no fetal pole.  Discussed this at length with the patient.  She was advised to call OB/GYN first thing tomorrow for follow-up appointment with repeat beta quantitative level and ultrasound, and return immediately to the emergency department for any new or worsening  symptoms.  Did discuss miscarriage symptoms with the patient and explained to that worsening abdominal pain or vaginal bleeding may be normal but to return for any concerns.  Patient is in agreement with  this plan of care. They express understanding and have no further questions at this time. Patient is afebrile, nontoxic appearing and in no acute distress. Discharged to home in stable condition..      Impression and Plan   Diagnosis   Threatened miscarriage (ICD10-CM O20.0, Discharge, Medical)   Plan   Condition: Improved.    Disposition: Discharged: Time  06/26/2019 18:07:00, to home.    Patient was given the following educational materials: Threatened Miscarriage, Easy-to-Read, Vaginal Bleeding During Pregnancy, First Trimester, Easy-to-Read.    Follow up with: DENISE DEVINE-MD, Physician - OB/GYN Within 1 to 2 days Take over-the-counter Tylenol as directed for pain  Follow-up with OB/GYN, call first thing tomorrow for appointment for repeat ultrasound, beta quantitative level in the next few days  Follow up with your primary care doctor  Return to the emergency department for any new, worsening or concerning symptoms   .    Counseled: I had a detailed discussion with the patient and/or guardian regarding the historical points/exam findings supporting the discharge diagnosis and need for outpatient followup. Discussed the need to return to the ER if symptoms persist/worsen, or for any questions/concerns that arise at home.    Signature Line     Electronically Signed on 06/26/2019 06:08 PM EDT   ________________________________________________   Vergia AlbertsMAFFEO-PA-C,  Lanecia Sliva N      Electronically Signed on 06/27/2019 10:59 PM EDT   ________________________________________________   PARASCHOS-MD,  THEODORE            Modified by: Maui Ahart-PA-C,  Gae Bihl N on 06/26/2019 05:44 PM EDT      Modified by: Markeia Harkless-PA-C,  Tarius Stangelo N on 06/26/2019 06:08 PM EDT

## 2019-06-26 NOTE — ED Notes (Signed)
ED Patient Education Note     Patient Education Materials Follows:  Obstetrics and Gynecology     Vaginal Bleeding During Pregnancy, First Trimester    A small amount of bleeding (spotting) from the vagina is common in early pregnancy. Sometimes the bleeding is normal and is not a problem, and sometimes it is a sign of something serious. Be sure to tell your doctor about any bleeding from your vagina right away.      HOME CARE     Watch your condition for any changes.     Follow your doctor's instructions about how active you can be.      If you are on bed rest:    ? You may need to stay in bed and only get up to use the bathroom.    ? You may be allowed to do some activities.    ? If you need help, make plans for someone to help you.     Write down:    ? The number of pads you use each day.    ? How often you change pads.    ? How soaked (saturated) your pads are.      Do not use tampons.     Do not douche.      Do not have sex or orgasms until your doctor says it is okay.     If you pass any tissue from your vagina, save the tissue so you can show it to your doctor.     Only take medicines as told by your doctor.      Do not take aspirin because it can make you bleed.     Keep all follow-up visits as told by your doctor.    GET HELP IF:     You bleed from your vagina.     You have cramps.     You have labor pains.      You have a fever that does not go away after you take medicine.     GET HELP RIGHT AWAY IF:     You have very bad cramps in your back or belly (abdomen).     You pass large clots or tissue from your vagina.      You bleed more.     You feel light-headed or weak.     You pass out (faint).      You have chills.     You are leaking fluid or have a gush of fluid from your vagina.     You pass out while pooping (having a bowel movement).     MAKE SURE YOU:     Understand these instructions.     Will watch your condition.     Will get help right away if you are not doing well or get worse.    This  information is not intended to replace advice given to you by your health care provider. Make sure you discuss any questions you have with your health care provider.    Document Released: 04/07/2014 Document Reviewed: 04/07/2014  Elsevier Interactive Patient Education ?2016 [employer]         Threatened Miscarriage    A threatened miscarriage is when you have vaginal bleeding during your first 20 weeks of pregnancy but the pregnancy has not ended. Your doctor will do tests to make sure you are still pregnant. The cause of the bleeding may not be known. This condition does not mean your pregnancy will  end. It does increase the risk of it ending (complete miscarriage).      HOME CARE     Make sure you keep all your doctor visits for prenatal care.     Get plenty of rest.     Do not have sex or use tampons if you have vaginal bleeding.     Do not douche.     Do not smoke or use drugs.     Do not drink alcohol.     Avoid caffeine.    GET HELP IF:     You have light bleeding from your vagina.     You have belly pain or cramping.      You have a fever.    GET HELP RIGHT AWAY IF:     You have heavy bleeding from your vagina.      You have clots of blood coming from your vagina.     You have bad pain or cramps in your low back or belly.     You have fever, chills, and bad belly pain.    MAKE SURE YOU:     Understand these instructions.     Will watch your condition.     Will get help right away if you are not doing well or get worse.    This information is not intended to replace advice given to you by your health care provider. Make sure you discuss any questions you have with your health care provider.    Document Released: 11/03/2008 Document Revised: 11/26/2013 Document Reviewed: 09/17/2013  Elsevier Interactive Patient Education ?2016 Elsevier Inc.

## 2019-06-27 LAB — SMEAR FUNGUS: FINAL REPORT: NONE SEEN

## 2019-06-28 LAB — C.TRACHOMATIS N.GONORRHOEAE DNA
Chlamydia trachomatis, NAA: POSITIVE — AB
Neisseria Gonorrhoeae, NAA: NEGATIVE

## 2019-10-23 LAB — URINALYSIS W/ RFLX MICROSCOPIC
Amorphous, UA: NONE SEEN /HPF
Bilirubin Urine: NEGATIVE
Blood, Urine: NEGATIVE
Glucose, UA: NEGATIVE mg/dL
MUCUS, URINE: NONE SEEN /LPF
Nitrite, Urine: NEGATIVE
Protein, UA: 30 — AB
RBC, UA: NONE SEEN /HPF (ref 0–2)
Specific Gravity, UA: 1.026 (ref 1.003–1.035)
Urobilinogen, Urine: 1 EU/dL
pH, UA: 7.5 (ref 4.5–8.0)

## 2019-10-23 LAB — BASIC METABOLIC PANEL
Anion Gap: 9 mmol/L (ref 2–17)
BUN: 6 mg/dL (ref 6–20)
CO2: 21 mmol/L — ABNORMAL LOW (ref 22–29)
Calcium: 9 mg/dL (ref 8.6–10.0)
Chloride: 101 mmol/L (ref 98–107)
Creatinine: 0.5 mg/dL (ref 0.5–0.9)
GFR African American: 160 mL/min/{1.73_m2} (ref >=90)
GFR Non-African American: 138 mL/min/1.73m^2 (ref >=90)
Glucose: 83 mg/dL (ref 70–99)
Osmolaliy Calculated: 259 mosm/kg — ABNORMAL LOW (ref 270–287)
Potassium: 3.6 mmol/L (ref 3.5–5.3)
Sodium: 131 mmol/L — ABNORMAL LOW (ref 135–145)

## 2019-10-23 LAB — CBC WITH AUTO DIFFERENTIAL
Basophils %: 0.2 % (ref 0.0–2.0)
Basophils Absolute: 0 10*3/uL (ref 0.0–0.2)
Eosinophils %: 0.5 % (ref 0.0–7.0)
Eosinophils Absolute: 0 10*3/uL (ref 0.0–0.5)
Hematocrit: 32.1 % — ABNORMAL LOW (ref 34.0–47.0)
Hemoglobin: 10.5 g/dL — ABNORMAL LOW (ref 11.5–15.7)
Immature Grans (Abs): 0.03 10*3/uL (ref 0.00–0.06)
Immature Granulocytes %: 0.4 % (ref 0.1–0.6)
Lymphocytes Absolute: 1.5 10*3/uL (ref 1.0–3.2)
Lymphocytes: 18.3 % (ref 15.0–45.0)
MCH: 24.4 pg — ABNORMAL LOW (ref 27.0–34.5)
MCHC: 32.7 g/dL (ref 32.0–36.0)
MCV: 74.7 fL — ABNORMAL LOW (ref 81.0–99.0)
MPV: 10.8 fL (ref 7.2–13.2)
Monocytes %: 9.7 % (ref 4.0–12.0)
Monocytes Absolute: 0.8 10*3/uL (ref 0.3–1.0)
NRBC Absolute: 0 10*3/uL (ref 0.000–0.012)
NRBC Automated: 0 % (ref 0.0–0.2)
Neutrophils %: 70.9 % (ref 42.0–74.0)
Neutrophils Absolute: 5.9 10*3/uL (ref 1.6–7.3)
Platelets: 306 10*3/uL (ref 140–440)
RBC: 4.3 x10e6/mcL (ref 3.60–5.20)
RDW: 15.1 % (ref 11.0–16.0)
WBC: 8.4 10*3/uL (ref 3.8–10.6)

## 2019-10-23 LAB — HCG, QUANTITATIVE, PREGNANCY: hCG Quant: 67559 m[IU]/mL — ABNORMAL HIGH (ref 0.0–5.3)

## 2019-10-23 LAB — POC PREGNANCY UR-QUAL: Preg Test, Ur: POSITIVE — AB

## 2019-10-23 NOTE — ED Notes (Signed)
ED Patient Education Note     Patient Education Materials Follows:  Easy-to-Read     General Headache Without Cause    A headache is pain or discomfort felt around the head or neck area. There are many causes and types of headaches. In some cases, the cause may not be found.       HOME CARE    Managing Pain     Take over-the-counter and prescription medicines only as told by your doctor.      Lie down in a dark, quiet room when you have a headache.      If directed, apply ice to the head and neck area:     ? Put ice in a plastic bag.     ? Place a towel between your skin and the bag.     ? Leave the ice on for 20 minutes, 2?3 times per day.      Use a heating pad or hot shower to apply heat to the head and neck area as told by your doctor.      Keep lights dim if bright lights bother you or make your headaches worse.     Eating and Drinking     Eat meals on a regular schedule.     Lessen how much alcohol you drink.      Lessen how much caffeine you drink, or stop drinking caffeine.     General Instructions     Keep all follow-up visits as told by your doctor. This is important.     Keep a journal to find out if certain things bring on headaches. For example, write down:    ? What you eat and drink.     ? How much sleep you get.     ? Any change to your diet or medicines.      Relax by getting a massage or doing other relaxing activities.      Lessen stress.      Sit up straight. Do not tighten (tense) your muscles.      Do not use tobacco products. This includes cigarettes, chewing tobacco, or e-cigarettes. If you need help quitting, ask your doctor.      Exercise regularly as told by your doctor.      Get enough sleep. This often means 7?9 hours of sleep.    GET HELP IF:     Your symptoms are not helped by medicine.     You have a headache that feels different than the other headaches.      You feel sick to your stomach (nauseous) or you throw up (vomit).      You have a fever.     GET HELP RIGHT AWAY IF:     Your  headache becomes really bad.     You keep throwing up.     You have a stiff neck.     You have trouble seeing.     You have trouble speaking.      You have pain in the eye or ear.      Your muscles are weak or you lose muscle control.      You lose your balance or have trouble walking.      You feel like you will pass out (faint) or you pass out.     You have confusion.    This information is not intended to replace advice given to you by your health care provider. Make sure you discuss   any questions you have with your health care provider.    Document Released: 08/30/2008 Document Revised: 08/12/2015 Document Reviewed: 03/16/2015  Elsevier Interactive Patient Education ?2016 [employer].      Obstetrics and Gynecology     Pregnancy and Urinary Tract Infection    A urinary tract infection (UTI) is a bacterial infection of the urinary tract. Infection of the urinary tract can include the ureters, kidneys (pyelonephritis), bladder (cystitis), and urethra (urethritis). All pregnant women should be screened for bacteria in the urinary tract. Identifying and treating a UTI will decrease the risk of preterm labor and developing more serious infections in both the mother and baby.    CAUSES    Bacteria germs cause almost all UTIs.     RISK FACTORS    Many factors can increase your chances of getting a UTI during pregnancy. These include:     Having a short urethra.     Poor toilet and hygiene habits.      Sexual intercourse.     Blockage of urine along the urinary tract.     Problems with the pelvic muscles or nerves.     Diabetes.     Obesity.     Bladder problems after having several children.     Previous history of UTI.    SIGNS AND SYMPTOMS     Pain, burning, or a stinging feeling when urinating.     Suddenly feeling the need to urinate right away (urgency).     Loss of bladder control (urinary incontinence).     Frequent urination, more than is common with pregnancy.     Lower abdominal or back discomfort.      Cloudy urine.     Blood in the urine (hematuria).     Fever.?    When the kidneys are infected, the symptoms may be:     Back pain.     Flank pain on the right side more so than the left.     Fever.     Chills.      Nausea.      Vomiting.    DIAGNOSIS    A urinary tract infection is usually diagnosed through urine tests. Additional tests and procedures are sometimes done. These may include:     Ultrasound exam of the kidneys, ureters, bladder, and urethra.      Looking in the bladder with a lighted tube (cystoscopy).    TREATMENT    Typically, UTIs can be treated with antibiotic medicines.     HOME CARE INSTRUCTIONS     Only take over-the-counter or prescription medicines as directed by your health care provider. If you were prescribed antibiotics, take them as directed. Finish them even if you start to feel better.      Drink enough fluids to keep your urine clear or pale yellow.     Do not have sexual intercourse until the infection is gone and your health care provider says it is okay.     Make sure you are tested for UTIs throughout your pregnancy. These infections often come back.?    Preventing a UTI in the Future     Practice good toilet habits. Always wipe from front to back. Use the tissue only once.     Do not hold your urine. Empty your bladder as soon as possible when the urge comes.     Do not douche or use deodorant sprays.     Wash with soap and warm water around  the genital area and the anus.     Empty your bladder before and after sexual intercourse.     Wear underwear with a cotton crotch.     Avoid caffeine and carbonated drinks. They can irritate the bladder.     Drink cranberry juice or take cranberry pills. This may decrease the risk of getting a UTI.     Do not drink alcohol.     Keep all your appointments and tests as scheduled.?    SEEK MEDICAL CARE IF:     Your symptoms get worse.     You are still having fevers 2 or more days after treatment begins.     You have a rash.     You feel that  you are having problems with medicines prescribed.     You have abnormal vaginal discharge.    SEEK IMMEDIATE MEDICAL CARE IF:     You have back or flank pain.     You have chills.     You have blood in your urine.     You have nausea and vomiting.     You have contractions of your uterus.     You have a gush of fluid from the vagina.    MAKE SURE YOU:     Understand these instructions. ?     Will watch your condition. ?     Will get help right away if you are not doing well or get worse. ?    This information is not intended to replace advice given to you by your health care provider. Make sure you discuss any questions you have with your health care provider.    Document Released: 03/18/2011 Document Revised: 09/11/2013 Document Reviewed: 10/12/2015  Elsevier Interactive Patient Education ?2016 Elsevier Inc.

## 2019-10-23 NOTE — ED Notes (Signed)
ED Triage Note       ED Secondary Triage Entered On:  10/23/2019 15:25 EST    Performed On:  10/23/2019 15:25 EST by Julien Girt, RN, Clyde Canterbury               General Information   Barriers to Learning :   None evident   ED Home Meds Section :   Document assessment   [facility] ED Fall Risk Section :   Document assessment   ED Advance Directives Section :   Document assessment   ED Palliative Screen :   N/A (prefilled for <65yo)   Julien Girt, RN, Darcy A - 10/23/2019 15:25 EST   (As Of: 10/23/2019 15:25:54 EST)   Problems(Active)    Anxiety (IMO  :75916 )  Name of Problem:   Anxiety ; Recorder:   Marcellina Millin; Confirmation:   Confirmed ; Classification:   Medical ; Code:   [phone] ; Contributor System:   Dietitian ; Last Updated:   12/20/2015 19:07 EST ; Life Cycle Date:   12/20/2015 ; Life Cycle Status:   Active ; Vocabulary:   IMO        Asthma (SNOMED CT  :599357017 )  Name of Problem:   Asthma ; Recorder:   Marcellina Millin; Confirmation:   Confirmed ; Classification:   Medical ; Code:   [id number] ; Contributor System:   Dietitian ; Last Updated:   12/20/2015 19:07 EST ; Life Cycle Date:   12/20/2015 ; Life Cycle Status:   Active ; Vocabulary:   SNOMED CT        Preeclampsia (SNOMED CT  :2330076226 )  Name of Problem:   Preeclampsia ; Recorder:   Julien Girt, RN, Programme researcher, broadcasting/film/video A; Confirmation:   Confirmed ; Classification:   Patient Stated ; Code:   [phone] ; Contributor System:   Dietitian ; Last Updated:   10/23/2019 15:25 EST ; Life Cycle Date:   10/23/2019 ; Life Cycle Status:   Active ; Vocabulary:   SNOMED CT          Diagnoses(Active)    Headache  Date:   10/23/2019 ; Diagnosis Type:   Reason For Visit ; Confirmation:   Complaint of ; Clinical Dx:   Headache ; Classification:   Medical ; Clinical Service:   Emergency medicine ; Code:   PNED ; Probability:   0 ; Diagnosis Code:   [id number]             -    Procedure History   (As Of: 10/23/2019 15:25:54 EST)     Anesthesia Minutes:   0 ;  Procedure Name:   None ; Procedure Minutes:   0            [facility] Fall Risk Assessment Tool   Hx of falling last 3 months ED Fall :   No   Patient confused or disoriented ED Fall :   No   Patient intoxicated or sedated ED Fall :   No   Patient impaired gait ED Fall :   No   Use a mobility assistance device ED Fall :   No   Patient altered elimination ED Fall :   No   [facility] ED Fall Score :   0    Julien Girt RN, Jewel Baize A - 10/23/2019 15:25 EST   ED Advance Directive   Advance Directive :   No   Julien Girt, RN, Jewel Baize A - 10/23/2019 15:25 EST   Med Hx   Medication List   (  As Of: 10/23/2019 15:25:54 EST)   No Known Home Medications     Dara Lords, RN, Salomon Fick A - 10/23/2019 15:25:51

## 2019-10-23 NOTE — ED Notes (Signed)
ED Patient Summary       ;       [facility]  [address], [place], [place] [address]  [phone]  Discharge Instructions (Patient)  _______________________________________     Name: Wendy Gentry, Wendy Gentry  DOB:  13-May-1996                   MRN: [id number]                   FIN: [id number]  Reason For Visit: Abdominal pain - Pregnancy; Headache; PREGNANT [redacted] WEEKS/WEAK/ABD PAIN/NAUSEA  Final Diagnosis: Head ache; Loss of appetite; UTI in pregnancy     Visit Date: 10/23/2019 15:13:00  Address: [address] [place] [address]  Phone: [phone]     Emergency Department Providers:         Primary Physician:            [facility] would like to thank you for allowing [place] to assist you with your healthcare needs. The following includes patient education materials and information regarding your injury/illness.     Follow-up Instructions:  You were seen today on an emergency basis. Please contact your primary care doctor for a follow up appointment. If you received a referral to a specialist doctor, it is important you follow-up as instructed.    It is important that you call your follow-up doctor to schedule and confirm the location of your next appointment. Your doctor may practice at multiple locations. The office location of your follow-up appointment may be different to the one written on your discharge instructions.    If you do not have a primary care doctor, please call [phone] for help in finding a [facility] Provider. For help in finding a specialist doctor, please call [phone].    The [facility] "Ask a Nurse" line in staffed by Registered Nurses and is a free service to the community. We are available Monday - Friday from 8am to 5pm to answer your questions about your health. Please call [phone].    If your condition gets worse before your follow-up with your primary care doctor or specialist, please return to  the Emergency Department.        Follow Up Appointments:  Primary Care Provider:      Name: PCP,  NONE      Phone:                  With: Address: When:   [name], Physician - OB/GYN [address], [address] [place], [place] [address]  [phone] Business (1) Within 1 week   Comments:   Start taking antibiotic, Keflex, for urinary tract infection.   Follow-up with OB/GYN              Printed Prescriptions:    Patient Education Materials:  Discharge Orders          Discharge Patient 10/23/19 17:14:00 EST         Comment:      Pregnancy and Urinary Tract Infection; General Headache Without Cause, Easy-to-Read     Pregnancy and Urinary Tract Infection    A urinary tract infection (UTI) is a bacterial infection of the urinary tract. Infection of the urinary tract can include the ureters, kidneys (pyelonephritis), bladder (cystitis), and urethra (urethritis). All pregnant women should be screened for bacteria in the urinary tract. Identifying and treating a UTI will decrease the  risk of preterm labor and developing more serious infections in both the mother and baby.    CAUSES    Bacteria germs cause almost all UTIs.     RISK FACTORS    Many factors can increase your chances of getting a UTI during pregnancy. These include:     Having a short urethra.     Poor toilet and hygiene habits.      Sexual intercourse.     Blockage of urine along the urinary tract.     Problems with the pelvic muscles or nerves.     Diabetes.     Obesity.     Bladder problems after having several children.     Previous history of UTI.    SIGNS AND SYMPTOMS     Pain, burning, or a stinging feeling when urinating.     Suddenly feeling the need to urinate right away (urgency).     Loss of bladder control (urinary incontinence).     Frequent urination, more than is common with pregnancy.     Lower abdominal or back discomfort.     Cloudy urine.     Blood in the urine (hematuria).     Fever.?    When the kidneys are infected, the  symptoms may be:     Back pain.     Flank pain on the right side more so than the left.     Fever.     Chills.      Nausea.      Vomiting.    DIAGNOSIS    A urinary tract infection is usually diagnosed through urine tests. Additional tests and procedures are sometimes done. These may include:     Ultrasound exam of the kidneys, ureters, bladder, and urethra.      Looking in the bladder with a lighted tube (cystoscopy).    TREATMENT    Typically, UTIs can be treated with antibiotic medicines.     HOME CARE INSTRUCTIONS     Only take over-the-counter or prescription medicines as directed by your health care provider. If you were prescribed antibiotics, take them as directed. Finish them even if you start to feel better.      Drink enough fluids to keep your urine clear or pale yellow.     Do not have sexual intercourse until the infection is gone and your health care provider says it is okay.     Make sure you are tested for UTIs throughout your pregnancy. These infections often come back.?    Preventing a UTI in the Future     Practice good toilet habits. Always wipe from front to back. Use the tissue only once.     Do not hold your urine. Empty your bladder as soon as possible when the urge comes.     Do not douche or use deodorant sprays.     Wash with soap and warm water around the genital area and the anus.     Empty your bladder before and after sexual intercourse.     Wear underwear with a cotton crotch.     Avoid caffeine and carbonated drinks. They can irritate the bladder.     Drink cranberry juice or take cranberry pills. This may decrease the risk of getting a UTI.     Do not drink alcohol.     Keep all your appointments and tests as scheduled.?    SEEK MEDICAL CARE IF:     Your symptoms get worse.  You are still having fevers 2 or more days after treatment begins.     You have a rash.     You feel that you are having problems with medicines prescribed.     You have abnormal vaginal discharge.    SEEK  IMMEDIATE MEDICAL CARE IF:     You have back or flank pain.     You have chills.     You have blood in your urine.     You have nausea and vomiting.     You have contractions of your uterus.     You have a gush of fluid from the vagina.    MAKE SURE YOU:     Understand these instructions. ?     Will watch your condition. ?     Will get help right away if you are not doing well or get worse. ?    This information is not intended to replace advice given to you by your health care provider. Make sure you discuss any questions you have with your health care provider.    Document Released: 03/18/2011 Document Revised: 09/11/2013 Document Reviewed: 10/12/2015  Elsevier Interactive Patient Education ?2016 Elsevier Inc.       General Headache Without Cause    A headache is pain or discomfort felt around the head or neck area. There are many causes and types of headaches. In some cases, the cause may not be found.       HOME CARE    Managing Pain     Take over-the-counter and prescription medicines only as told by your doctor.      Lie down in a dark, quiet room when you have a headache.      If directed, apply ice to the head and neck area:     ? Put ice in a plastic bag.     ? Place a towel between your skin and the bag.     ? Leave the ice on for 20 minutes, 2?3 times per day.      Use a heating pad or hot shower to apply heat to the head and neck area as told by your doctor.      Keep lights dim if bright lights bother you or make your headaches worse.     Eating and Drinking     Eat meals on a regular schedule.     Lessen how much alcohol you drink.      Lessen how much caffeine you drink, or stop drinking caffeine.     General Instructions     Keep all follow-up visits as told by your doctor. This is important.     Keep a journal to find out if certain things bring on headaches. For example, write down:    ? What you eat and drink.     ? How much sleep you get.     ? Any change to your diet or medicines.      Relax by  getting a massage or doing other relaxing activities.      Lessen stress.      Sit up straight. Do not tighten (tense) your muscles.      Do not use tobacco products. This includes cigarettes, chewing tobacco, or e-cigarettes. If you need help quitting, ask your doctor.      Exercise regularly as told by your doctor.      Get enough sleep. This often means 7?9 hours of sleep.  GET HELP IF:     Your symptoms are not helped by medicine.     You have a headache that feels different than the other headaches.      You feel sick to your stomach (nauseous) or you throw up (vomit).      You have a fever.     GET HELP RIGHT AWAY IF:     Your headache becomes really bad.     You keep throwing up.     You have a stiff neck.     You have trouble seeing.     You have trouble speaking.      You have pain in the eye or ear.      Your muscles are weak or you lose muscle control.      You lose your balance or have trouble walking.      You feel like you will pass out (faint) or you pass out.     You have confusion.    This information is not intended to replace advice given to you by your health care provider. Make sure you discuss any questions you have with your health care provider.    Document Released: 08/30/2008 Document Revised: 08/12/2015 Document Reviewed: 03/16/2015  Elsevier Interactive Patient Education ?2016 Elsevier Inc.         Allergy Info: No Known Medication Allergies     Medication Information:  [facility] ED Physicians provided you with a complete list of medications post discharge, if you have been instructed to stop taking a medication please ensure you also follow up with this information to your Primary Care Physician.  Unless otherwise noted, patient will continue to take medications as prescribed prior to the Emergency Room visit.  Any specific questions regarding your chronic medications and dosages should be discussed with your physician(s) and pharmacist.          cephalexin (Keflex 500 mg oral  capsule) 1 Capsules Oral (given by mouth) 4 times a day for 7 Days. Refills: 0.      Medications Administered During Visit:              Medication Dose Route   Sodium Chloride 0.9% 1000 mL IV Piggyback   acetaminophen 1000 mg Oral          Major Tests and Procedures:  The following procedures and tests were performed during your ED visit.  COMMON PROCEDURES%>  COMMON PROCEDURES COMMENTS%>          Laboratory Orders  Name Status Details   .Preg U POC Completed Urine, RT, RT - Routine, Collected, 10/23/19 15:47:00 EST, Nurse collect, 10/23/19 15:47:00 [place]/[facility], RAL POC Login   BMP Completed Blood, Stat, ST - Stat, 10/23/19 15:27:00 EST, 10/23/19 15:27:00 EST, Nurse collect, CALDWELL-PAC,  KELSEY C, Print label Y/N   C Urine Ordered Urine, Clean Catch, Stat, ST - Stat, 10/23/19 16:26:00 EST, Once, 10/23/19 16:26:00 EST, Nurse collect, Print label Y/N   CBCDIFF Completed Blood, Stat, ST - Stat, 10/23/19 15:27:00 EST, 10/23/19 15:27:00 EST, Nurse collect, CALDWELL-PAC,  KELSEY C, Print label Y/N   HCG QT Completed Blood, Stat, ST - Stat, 10/23/19 15:34:00 EST, 10/23/19 15:34:00 EST, Nurse collect, CALDWELL-PAC,  KELSEY C, Print label Y/N   UA Rflx Micro Completed Urine, Clean Catch, Stat, ST - Stat, 10/23/19 15:48:00 EST, Once, 10/23/19 15:48:00 EST, Nurse collect               Radiology Orders  Name Status Details   [place]  Pregnancy Transvaginal Completed 10/23/19 15:33:00 EST, STAT 1 hour or less, Reason: Pregnancy related conditions, unspecified, unspecified trimester, < 12 weeks, Transport Mode: STRETCHER, [email or web address]_subspecialty               Patient Care Orders  Name Status Details   Discharge Patient Ordered 10/23/19 17:14:00 EST   ED Assessment Adult Completed 10/23/19 15:25:08 EST, 10/23/19 15:25:08 EST   ED Secondary Triage Completed 10/23/19 15:25:08 EST, 10/23/19 15:25:08 EST   ED Triage Adult Completed 10/23/19 15:13:16 EST, 10/23/19 15:13:16 EST   POC-Urine Pregnancy Test collect Completed 10/23/19  15:27:00 EST, Once, 10/23/19 15:27:00 EST       ---------------------------------------------------------------------------------------------------------------------  [facility]) encourages you to self-enroll in the [facility] Patient Portal.  [facility] Patient Portal will allow you to manage your personal health information securely from your own electronic device now and in the future.  To begin your Patient Portal enrollment process, please visit [email or web address] Click on "Sign up now" under [facility].  If you find that you need additional assistance on the [facility] Patient Portal or need a copy of your medical records, please call the [facility] Medical Records Office at [phone].  Comment:

## 2019-10-23 NOTE — ED Notes (Signed)
ED Triage Note       ED Triage Adult Entered On:  10/23/2019 15:25 EST    Performed On:  10/23/2019 15:22 EST by Dara Lords, RN, Darcy A               Triage   Chief Complaint :   Pt reports feeling shaky, having headache, and loss of appetite. Hx of preeclampsia. Pt is [redacted] weeks pregnant. No local OB.   Numeric Rating Pain Scale :   0 = No pain   [place] Mode of Arrival :   Private vehicle   Infectious Disease Documentation :   Document assessment   Temperature Oral :   37.2 degC(Converted to: 99.0 degF)    Respiratory Rate :   16 br/min   Systolic Blood Pressure :   102 mmHg   Diastolic Blood Pressure :   79 mmHg   SpO2 :   99 %   Oxygen Therapy :   Room air   Patient presentation :   None of the above   Chief Complaint or Presentation suggest infection :   No   Weight Dosing :   82 kg(Converted to: 180 lb 12 oz)    Height :   156 cm(Converted to: 5 ft 1 in)    Body Mass Index Dosing :   34 kg/m2   Dara Lords, Geophysicist/field seismologist A - 10/23/2019 15:22 EST   DCP GENERIC CODE   Tracking Acuity :   3   Tracking Group :   ED [employer]   [place], RN, Acupuncturist A - 10/23/2019 15:22 EST   ED General Section :   Document assessment   Pregnancy Status :   Confirmed Pregnancy   ED Allergies Section :   Document assessment   ED Reason for Visit Section :   Document assessment   ED Quick Assessment :   Patient appears awake, alert, oriented to baseline. Skin warm and dry. Moves all extremities. Respiration even and unlabored. Appears in no apparent distress.   Dara Lords, RN, Acupuncturist A - 10/23/2019 15:22 EST   ID Risk Screen Symptoms   Recent Travel History :   No recent travel   Close Contact with COVID-19 ID :   No   Last 14 days COVID-19 ID :   No   TB Symptom Screen :   No symptoms   C. diff Symptom/History ID :   Neither of the above   Dara Lords, RN, Salomon Fick A - 10/23/2019 15:22 EST   Allergies   (As Of: 10/23/2019 15:25:07 EST)   Allergies (Active)   No Known Medication Allergies  Estimated Onset Date:   Unspecified ; Created By:    Tori Milks; Reaction Status:   Active ; Category:   Drug ; Substance:   No Known Medication Allergies ; Type:   Allergy ; Updated By:   Tori Milks; Reviewed Date:   10/23/2019 15:23 EST        Psycho-Social   Last 3 mo, thoughts killing self/others :   Patient denies   ED Behavioral Activity Rating Scale :   4 - Quiet and awake (normal level of activity)   Dara Lords, RN, Gwenyth Bender - 10/23/2019 15:22 EST   ED Reason for Visit   (As Of: 10/23/2019 15:25:07 EST)   Problems(Active)    Anxiety (IMO  :72536 )  Name of Problem:   Anxiety ; Recorder:   Tori Milks; Confirmation:   Confirmed ; Classification:  Medical ; Code:   [id number] ; Contributor System:   Dietitian ; Last Updated:   12/20/2015 19:07 EST ; Life Cycle Date:   12/20/2015 ; Life Cycle Status:   Active ; Vocabulary:   IMO        Asthma (SNOMED CT  :[id number] )  Name of Problem:   Asthma ; Recorder:   Marcellina Millin; Confirmation:   Confirmed ; Classification:   Medical ; Code:   [id number] ; Contributor System:   Dietitian ; Last Updated:   12/20/2015 19:07 EST ; Life Cycle Date:   12/20/2015 ; Life Cycle Status:   Active ; Vocabulary:   SNOMED CT          Diagnoses(Active)    Headache  Date:   10/23/2019 ; Diagnosis Type:   Reason For Visit ; Confirmation:   Complaint of ; Clinical Dx:   Headache ; Classification:   Medical ; Clinical Service:   Emergency medicine ; Code:   PNED ; Probability:   0 ; Diagnosis Code:   [phone]

## 2019-10-23 NOTE — Discharge Summary (Signed)
 ED Clinical Summary                        [facility]  [address]  [place], [place] [address]  [phone]           PERSON INFORMATION  Name: Wendy Gentry, Wendy Gentry Age:  23 Years DOB: 1996-10-10   Sex: Female Language: English PCP: PCP,  NONE   Marital Status: Single Phone: [phone] Med Service: MED-Medicine   MRN: [id number] Acct# [id number] Arrival: 10/23/2019 15:13:00   Visit Reason: Abdominal pain - Pregnancy; Headache; PREGNANT [redacted] WEEKS/WEAK/ABD PAIN/NAUSEA Acuity: 3 LOS: 000 02:17   Address:    [address] [place] [address]   Diagnosis:    Head ache; Loss of appetite; UTI in pregnancy  Medications:          New Medications  Printed Prescriptions  cephalexin (Keflex 500 mg oral capsule) 1 Capsules Oral (given by mouth) 4 times a day for 7 Days. Refills: 0.  Last Dose:____________________      Medications Administered During Visit:                Medication Dose Route   Sodium Chloride  0.9% 1000 mL IV Piggyback   acetaminophen  1000 mg Oral               Allergies      No Known Medication Allergies      Major Tests and Procedures:  The following procedures and tests were performed during your ED visit.  COMMON PROCEDURES%>  COMMON PROCEDURES COMMENTS%>                PROVIDER INFORMATION               Provider Role Assigned Sampson REENA LARRAINE JAYSON ED MidLevel 10/23/2019 15:16:23    Perrino, RN, Marton PARAS ED Nurse 10/23/2019 15:30:53        Attending Physician:  RAYMA GLENDIA EWINGS      Admit Doc  BURNS-MD,  GLENDIA EWINGS     Consulting Doc       VITALS INFORMATION  Vital Sign Triage Latest   Temp Oral ORAL_1%> ORAL%>   Temp Temporal TEMPORAL_1%> TEMPORAL%>   Temp Intravascular INTRAVASCULAR_1%> INTRAVASCULAR%>   Temp Axillary AXILLARY_1%> AXILLARY%>   Temp Rectal RECTAL_1%> RECTAL%>   02 Sat 99 % 99 %   Respiratory Rate RATE_1%> RATE%>   Peripheral Pulse Rate PULSE RATE_1%> PULSE RATE%>   Apical Heart Rate HEART RATE_1%> HEART RATE%>   Blood Pressure BLOOD PRESSURE_1%>/  BLOOD PRESSURE_1%>79 mmHg BLOOD PRESSURE%> / BLOOD PRESSURE%>70 mmHg                 Immunizations      No Immunizations Documented This Visit          DISCHARGE INFORMATION   Discharge Disposition: H Outpt-Sent Home   Discharge Location:  Home   Discharge Date and Time:  10/23/2019 17:30:00   ED Checkout Date and Time:  10/23/2019 17:30:00     DEPART REASON INCOMPLETE INFORMATION               Depart Action Incomplete Reason   Interactive View/I&O Recently assessed               Problems      Active           Anxiety          Asthma  Smoking Status      Never smoker         PATIENT EDUCATION INFORMATION  Instructions:     Pregnancy and Urinary Tract Infection; General Headache Without Cause, Easy-to-Read     Follow up:                   With: Address: When:   ELSPETH JO, Physician - OB/GYN [address], [address] [place], [place] [address]  [phone] Business (1) Within 1 week   Comments:   Start taking antibiotic, Keflex, for urinary tract infection.   Follow-up with OB/GYN              ED PROVIDER DOCUMENTATION     Patient:   Wendy Gentry, Wendy Gentry             MRN: [id number]            FIN: [id number]               Age:   23 years     Sex:  Female     DOB:  1996-08-11   Associated Diagnoses:   UTI in pregnancy; Head ache; Loss of appetite   Author:   REENA MORT C      Basic Information   Time seen: Provider Seen (ST)   ED Provider/Time:    CALDWELL-PAC,  KELSEY C / 10/23/2019 15:16  .   Additional information: Chief Complaint from Nursing Triage Note   Chief Complaint  Chief Complaint: Pt reports feeling shaky, having headache, and loss of appetite. Hx of preeclampsia. Pt is [redacted] weeks pregnant. No local OB. (10/23/19 15:22:00).      History of Present Illness   23 year old G3P2 female with past medical history of anxiety, asthma, and preeclampsia presents to emergency room with multiple complaints in early pregnancy.  Patient reports that her last menstrual cycle was 08/06/2019 and  she believes that she is approximately [redacted] weeks pregnant.  She just recently moved here from [place] , does not have an OB/GYN yet.  She had a miscarriage approximately 4 months ago.  She states that during her first pregnancy she was diagnosed with preeclampsia and was induced early at 34 weeks.  Her symptoms were headache and dizziness.  She reports that for the past week she has felt shaky, has had loss of appetite.  She states that she has no desire to eat food, and has diffuse achy abdominal pain whenever she eats food.  She is concerned for dehydration.  She also reports that she has a headache that is frontal, achy, with fluctuating intensity.  She believes this is due to dehydration.  She has not taken any medication for these complaints.  She denies any vaginal bleeding or vaginal discharge.  Patient denies any fever, chills, chest pain, shortness of breath, nausea, vomiting, diarrhea, dysuria, dizziness..        Review of Systems   Constitutional symptoms:  Fatigue, decreased appetite, no fever, no chills.    Skin symptoms:  No rash,    Eye symptoms:  Vision unchanged.   ENMT symptoms:  No sore throat,    Respiratory symptoms:  No shortness of breath,    Cardiovascular symptoms:  No chest pain,    Gastrointestinal symptoms:  Abdominal pain, mild, diffuse, achy, no nausea, no vomiting, no diarrhea.    Genitourinary symptoms:  No dysuria,    Musculoskeletal symptoms:  No back pain,    Neurologic symptoms:  Headache, no dizziness, no  altered level of consciousness, no numbness, no tingling, no focal weakness.              Additional review of systems information: All other systems reviewed and otherwise negative.      Health Status   Allergies:    Allergic Reactions (All)  No Known Medication Allergies.      Past Medical/ Family/ Social History   Medical history: Reviewed as documented in chart.   Surgical history: Reviewed as documented in chart.   Family history: Not significant.   Social history:  Reviewed as documented in chart.   Problem list:    Active Problems (3)  Anxiety   Asthma   Preeclampsia   .      Physical Examination               Vital Signs   Vital Signs   10/23/2019 15:22 EST Systolic Blood Pressure 137 mmHg    Diastolic Blood Pressure 79 mmHg    Temperature Oral 37.2 degC    Heart Rate Monitored 74 bpm    Respiratory Rate 16 br/min    SpO2 99 %   .   Measurements   10/23/2019 15:25 EST Body Mass Index est meas 33.69 kg/m2    Body Mass Index Measured 33.69 kg/m2   10/23/2019 15:22 EST Height/Length Measured 156 cm    Weight Dosing 82 kg   .   Basic Oxygen Information   10/23/2019 15:22 EST SpO2 99 %    Oxygen Therapy Room air   .   General:  Alert, no acute distress.    Skin:  Warm, dry, pink.    Head:  Normocephalic.   Neck:  Supple, trachea midline.    Eye:  Pupils are equal, round and reactive to light, extraocular movements are intact.    Ears, nose, mouth and throat:  Oral mucosa moist.   Cardiovascular:  Regular rate and rhythm.   Respiratory:  Lungs are clear to auscultation, respirations are non-labored.    Back:  Nontender, Normal range of motion.    Musculoskeletal:  Normal ROM, no tenderness.    Chest wall   Gastrointestinal:  Soft, Nontender, Non distended, Normal bowel sounds, No organomegaly.    Neurological:  Alert and oriented to person, place, time, and situation, No focal neurological deficit observed, normal sensory observed, normal motor observed.    Lymphatics:  No lymphadenopathy.   Psychiatric:  Cooperative, appropriate mood & affect.       Medical Decision Making   Rationale:  PA/NP reviewed with co-signing physician: lab results, radiology studies, medication, diagnosis, and plan of care.   Documents reviewed:  Emergency department nurses' notes, flowsheet, emergency department records, prior records, vital signs.    Results review:  Lab results : Lab View   10/23/2019 16:12 EST Estimated Creatinine Clearance 174.36 mL/min   10/23/2019 15:49 EST UA Color Yellow    UA  Appear Turbid    UA Glucose Negative    UA Bili Negative    UA Ketones Trace    UA Spec Grav 1.026    UA Blood Negative    UA pH 7.5    Protein U 30    UA Urobilinogen 1.0 EU/dL    UA Nitrite Negative    UA Leuk Est Large    WBC U 11-20 /HPF    RBC U Not Seen /HPF    Sq Epi U Many /LPF    Bacteria U Few    Mucus U Not Seen /  LPF    Amorph U Not Seen /HPF   10/23/2019 15:47 EST Preg U POC Positive   10/23/2019 15:45 EST WBC 8.4 x10e3/mcL    RBC 4.30 x10e6/mcL    Hgb 10.5 g/dL  LOW    HCT 67.8 %  LOW    MCV 74.7 fL  LOW    MCH 24.4 pg  LOW    MCHC 32.7 g/dL    RDW 84.8 %    Platelet 306 x10e3/mcL    MPV 10.8 fL    Neutro Auto 70.9 %    Neutro Absolute 5.9 x10e3/mcL    Immature Grans Percent 0.4 %    Immature Grans Absolute 0.03 x10e3/mcL    Lymph Auto 18.3 %    Lymph Absolute 1.5 x10e3/mcL    Mono Auto 9.7 %    Mono Absolute 0.8 x10e3/mcL    Eosinophil Percent 0.5 %    Eos Absolute 0.0 x10e3/mcL    Basophil Auto 0.2 %    Baso Absolute 0.0 x10e3/mcL    NRBC Absolute Auto 0.000 x10e3/mcL    NRBC Percent Auto 0.0 %    Sodium Lvl 131 mmol/L  LOW    Potassium Lvl 3.6 mmol/L    Chloride 101 mmol/L    CO2 21 mmol/L  LOW    Glucose Random 83 mg/dL    BUN 6 mg/dL    Creatinine Lvl 0.5 mg/dL    AGAP 9 mmol/L    Osmolality Calc 259 mOsm/kg  LOW    Calcium Lvl 9.0 mg/dL    eGFR AA 839 fO/fpw/8.26f    eGFR Non-AA 138 mL/min/1.48m    Beta hCG Qnt 67,559.0 mIU/mL  HI   10/23/2019 15:25 EST Estimated Creatinine Clearance 145.30 mL/min   .   Radiology results:  Rad Results (ST)   US  Pregnancy Transvaginal  ?  10/23/19 16:30:16  US  Pregnancy Transvaginal 10/23/19    INDICATION: Pregnancy related conditions, unspecified, unspecified trimester    COMPARISON: 22 July, 2020    TECHNIQUE: Multiple transverse and longitudinal images, color Doppler images and  M mode images    FINDINGS: Exam shows a single intrauterine gestation with gestational sac  dimension 4.47 suggestive of 10 week 2 day gestation. Crown-rump length measures  4.37  suggestive of intrauterine gestation 11 weeks 1 day. Fetal heart tones  register at 165 bpm. Estimated due date by ultrasound 11 June, 2020 the right  ovary was not visualized. The left ovary measured 3.6 x 2.3 x 3.6 cm and  appeared within limits of normal. No free fluid evident. No adnexal mass  evident. No perigestational hemorrhage evident.    IMPRESSION: Single intrauterine gestation 10 weeks 5 day by ultrasound appearing  viable at this time with estimated due date 11 June, 2021.  ?  Signed By: JEANEEN GORDY KATHEE SABRA   Notes:  Patient presents to emergency room due to multiple complaints in early pregnancy.  Patient reports that she has felt shaky and has had loss of appetite.  She also reports that she had intermittent abdominal pain.  She does not currently have OB/GYN.  Ultrasound was done which showed evidence of single intrauterine gestation 10 weeks 5 days, ultrasound appearing viable at this time.  It is elevated hCG consistent with this.  The rest patient's lab work was reassuring.  Urinalysis reveals evidence of UTI.  Will start patient on course of Keflex.  Patient is no vaginal bleeding, no current abdominal pain. Patient is also concerned due to preeclampsia in her prior  pregnancy.  Patient's blood pressure is below 140/90, she has no peripheral edema.  Comprehensive physical and neurological exam was unremarkable.  I will give her OB/GYN follow-up.  Discussed with her signs and symptoms for which return to emergency department. Patient continues to rest comfortably in exam room, smiling and nontoxic appearing.  Remains hemodynamically stable.  No evidence of distress or discomfort. Safe for discharge home.      Reexamination/ Reevaluation   Time: 10/23/2019 17:00:00 .   Vital signs   Basic Oxygen Information   10/23/2019 15:22 EST SpO2 99 %    Oxygen Therapy Room air      Course: improving.   Pain status: decreased.      Impression and Plan   Diagnosis   UTI in pregnancy (ICD10-CM O23.40,  Discharge, Medical)   Head ache (ICD10-CM R51, Discharge, Medical)   Loss of appetite (ICD10-CM R63.0, Discharge, Medical)   Plan   Condition: Improved, Stable.    Disposition: Discharged: to home.    Prescriptions: Launch prescriptions   Pharmacy:  Keflex 500 mg oral capsule (Prescribe): 500 mg, 1 caps, Oral, QID, for 7 days, 28 caps, 0 Refill(s).    Patient was given the following educational materials: General Headache Without Cause, Easy-to-Read, Pregnancy and Urinary Tract Infection.    Follow up with: ELSPETH JO, Physician - OB/GYN Within 1 week Start taking antibiotic, Keflex, for urinary tract infection.  Follow-up with OB/GYN.    Counseled: Patient.    Notes: I had a detailed discussion with the patient and/or guardian regarding the historical points/exam findings and test results supporting the discharge diagnosis. Also discussed need for outpatient followup for continue management and reevaluation of current symptoms. Discussed the need to return to the ER if symptoms persist/worsen, or for any questions/concerns that arise at home.

## 2019-10-23 NOTE — ED Provider Notes (Signed)
 Abdominal pain - Pregnancy        Patient:   Wendy Gentry, Wendy Gentry             MRN: [id number]            FIN: [id number]               Age:   23 years     Sex:  Female     DOB:  1996/03/16   Associated Diagnoses:   UTI in pregnancy; Head ache; Loss of appetite   Author:   REENA MORT C      Basic Information   Time seen: Provider Seen (ST)   ED Provider/Time:    CALDWELL-PAC,  KELSEY C / 10/23/2019 15:16  .   Additional information: Chief Complaint from Nursing Triage Note   Chief Complaint  Chief Complaint: Pt reports feeling shaky, having headache, and loss of appetite. Hx of preeclampsia. Pt is [redacted] weeks pregnant. No local OB. (10/23/19 15:22:00).      History of Present Illness   23 year old G3P2 female with past medical history of anxiety, asthma, and preeclampsia presents to emergency room with multiple complaints in early pregnancy.  Patient reports that her last menstrual cycle was 08/06/2019 and she believes that she is approximately [redacted] weeks pregnant.  She just recently moved here from [place] , does not have an OB/GYN yet.  She had a miscarriage approximately 4 months ago.  She states that during her first pregnancy she was diagnosed with preeclampsia and was induced early at 34 weeks.  Her symptoms were headache and dizziness.  She reports that for the past week she has felt shaky, has had loss of appetite.  She states that she has no desire to eat food, and has diffuse achy abdominal pain whenever she eats food.  She is concerned for dehydration.  She also reports that she has a headache that is frontal, achy, with fluctuating intensity.  She believes this is due to dehydration.  She has not taken any medication for these complaints.  She denies any vaginal bleeding or vaginal discharge.  Patient denies any fever, chills, chest pain, shortness of breath, nausea, vomiting, diarrhea, dysuria, dizziness..        Review of Systems   Constitutional symptoms:  Fatigue, decreased appetite, no fever, no  chills.    Skin symptoms:  No rash,    Eye symptoms:  Vision unchanged.   ENMT symptoms:  No sore throat,    Respiratory symptoms:  No shortness of breath,    Cardiovascular symptoms:  No chest pain,    Gastrointestinal symptoms:  Abdominal pain, mild, diffuse, achy, no nausea, no vomiting, no diarrhea.    Genitourinary symptoms:  No dysuria,    Musculoskeletal symptoms:  No back pain,    Neurologic symptoms:  Headache, no dizziness, no altered level of consciousness, no numbness, no tingling, no focal weakness.              Additional review of systems information: All other systems reviewed and otherwise negative.      Health Status   Allergies:    Allergic Reactions (All)  No Known Medication Allergies.      Past Medical/ Family/ Social History   Medical history: Reviewed as documented in chart.   Surgical history: Reviewed as documented in chart.   Family history: Not significant.   Social history: Reviewed as documented in chart.   Problem list:    Active Problems (3)  Anxiety  Asthma   Preeclampsia   .      Physical Examination               Vital Signs   Vital Signs   10/23/2019 15:22 EST Systolic Blood Pressure 137 mmHg    Diastolic Blood Pressure 79 mmHg    Temperature Oral 37.2 degC    Heart Rate Monitored 74 bpm    Respiratory Rate 16 br/min    SpO2 99 %   .   Measurements   10/23/2019 15:25 EST Body Mass Index est meas 33.69 kg/m2    Body Mass Index Measured 33.69 kg/m2   10/23/2019 15:22 EST Height/Length Measured 156 cm    Weight Dosing 82 kg   .   Basic Oxygen Information   10/23/2019 15:22 EST SpO2 99 %    Oxygen Therapy Room air   .   General:  Alert, no acute distress.    Skin:  Warm, dry, pink.    Head:  Normocephalic.   Neck:  Supple, trachea midline.    Eye:  Pupils are equal, round and reactive to light, extraocular movements are intact.    Ears, nose, mouth and throat:  Oral mucosa moist.   Cardiovascular:  Regular rate and rhythm.   Respiratory:  Lungs are clear to auscultation, respirations  are non-labored.    Back:  Nontender, Normal range of motion.    Musculoskeletal:  Normal ROM, no tenderness.    Chest wall   Gastrointestinal:  Soft, Nontender, Non distended, Normal bowel sounds, No organomegaly.    Neurological:  Alert and oriented to person, place, time, and situation, No focal neurological deficit observed, normal sensory observed, normal motor observed.    Lymphatics:  No lymphadenopathy.   Psychiatric:  Cooperative, appropriate mood & affect.       Medical Decision Making   Rationale:  PA/NP reviewed with co-signing physician: lab results, radiology studies, medication, diagnosis, and plan of care.   Documents reviewed:  Emergency department nurses' notes, flowsheet, emergency department records, prior records, vital signs.    Results review:  Lab results : Lab View   10/23/2019 16:12 EST Estimated Creatinine Clearance 174.36 mL/min   10/23/2019 15:49 EST UA Color Yellow    UA Appear Turbid    UA Glucose Negative    UA Bili Negative    UA Ketones Trace    UA Spec Grav 1.026    UA Blood Negative    UA pH 7.5    Protein U 30    UA Urobilinogen 1.0 EU/dL    UA Nitrite Negative    UA Leuk Est Large    WBC U 11-20 /HPF    RBC U Not Seen /HPF    Sq Epi U Many /LPF    Bacteria U Few    Mucus U Not Seen /LPF    Amorph U Not Seen /HPF   10/23/2019 15:47 EST Preg U POC Positive   10/23/2019 15:45 EST WBC 8.4 x10e3/mcL    RBC 4.30 x10e6/mcL    Hgb 10.5 g/dL  LOW    HCT 67.8 %  LOW    MCV 74.7 fL  LOW    MCH 24.4 pg  LOW    MCHC 32.7 g/dL    RDW 84.8 %    Platelet 306 x10e3/mcL    MPV 10.8 fL    Neutro Auto 70.9 %    Neutro Absolute 5.9 x10e3/mcL    Immature Grans Percent 0.4 %    Immature Grans  Absolute 0.03 x10e3/mcL    Lymph Auto 18.3 %    Lymph Absolute 1.5 x10e3/mcL    Mono Auto 9.7 %    Mono Absolute 0.8 x10e3/mcL    Eosinophil Percent 0.5 %    Eos Absolute 0.0 x10e3/mcL    Basophil Auto 0.2 %    Baso Absolute 0.0 x10e3/mcL    NRBC Absolute Auto 0.000 x10e3/mcL    NRBC Percent Auto 0.0 %    Sodium  Lvl 131 mmol/L  LOW    Potassium Lvl 3.6 mmol/L    Chloride 101 mmol/L    CO2 21 mmol/L  LOW    Glucose Random 83 mg/dL    BUN 6 mg/dL    Creatinine Lvl 0.5 mg/dL    AGAP 9 mmol/L    Osmolality Calc 259 mOsm/kg  LOW    Calcium Lvl 9.0 mg/dL    eGFR AA 839 fO/fpw/8.26f    eGFR Non-AA 138 mL/min/1.14m    Beta hCG Qnt 67,559.0 mIU/mL  HI   10/23/2019 15:25 EST Estimated Creatinine Clearance 145.30 mL/min   .   Radiology results:  Rad Results (ST)   US  Pregnancy Transvaginal  ?  10/23/19 16:30:16  US  Pregnancy Transvaginal 10/23/19    INDICATION: Pregnancy related conditions, unspecified, unspecified trimester    COMPARISON: 22 July, 2020    TECHNIQUE: Multiple transverse and longitudinal images, color Doppler images and  M mode images    FINDINGS: Exam shows a single intrauterine gestation with gestational sac  dimension 4.47 suggestive of 10 week 2 day gestation. Crown-rump length measures  4.37 suggestive of intrauterine gestation 11 weeks 1 day. Fetal heart tones  register at 165 bpm. Estimated due date by ultrasound 11 June, 2020 the right  ovary was not visualized. The left ovary measured 3.6 x 2.3 x 3.6 cm and  appeared within limits of normal. No free fluid evident. No adnexal mass  evident. No perigestational hemorrhage evident.    IMPRESSION: Single intrauterine gestation 10 weeks 5 day by ultrasound appearing  viable at this time with estimated due date 11 June, 2021.  ?  Signed By: JEANEEN GORDY KATHEE SABRA   Notes:  Patient presents to emergency room due to multiple complaints in early pregnancy.  Patient reports that she has felt shaky and has had loss of appetite.  She also reports that she had intermittent abdominal pain.  She does not currently have OB/GYN.  Ultrasound was done which showed evidence of single intrauterine gestation 10 weeks 5 days, ultrasound appearing viable at this time.  It is elevated hCG consistent with this.  The rest patient's lab work was reassuring.  Urinalysis reveals  evidence of UTI.  Will start patient on course of Keflex.  Patient is no vaginal bleeding, no current abdominal pain. Patient is also concerned due to preeclampsia in her prior pregnancy.  Patient's blood pressure is below 140/90, she has no peripheral edema.  Comprehensive physical and neurological exam was unremarkable.  I will give her OB/GYN follow-up.  Discussed with her signs and symptoms for which return to emergency department. Patient continues to rest comfortably in exam room, smiling and nontoxic appearing.  Remains hemodynamically stable.  No evidence of distress or discomfort. Safe for discharge home.      Reexamination/ Reevaluation   Time: 10/23/2019 17:00:00 .   Vital signs   Basic Oxygen Information   10/23/2019 15:22 EST SpO2 99 %    Oxygen Therapy Room air      Course: improving.  Pain status: decreased.      Impression and Plan   Diagnosis   UTI in pregnancy (ICD10-CM O23.40, Discharge, Medical)   Head ache (ICD10-CM R51, Discharge, Medical)   Loss of appetite (ICD10-CM R63.0, Discharge, Medical)   Plan   Condition: Improved, Stable.    Disposition: Discharged: to home.    Prescriptions: Launch prescriptions   Pharmacy:  Keflex 500 mg oral capsule (Prescribe): 500 mg, 1 caps, Oral, QID, for 7 days, 28 caps, 0 Refill(s).    Patient was given the following educational materials: General Headache Without Cause, Easy-to-Read, Pregnancy and Urinary Tract Infection.    Follow up with: ELSPETH JO, Physician - OB/GYN Within 1 week Start taking antibiotic, Keflex, for urinary tract infection.  Follow-up with OB/GYN.    Counseled: Patient.    Notes: I had a detailed discussion with the patient and/or guardian regarding the historical points/exam findings and test results supporting the discharge diagnosis. Also discussed need for outpatient followup for continue management and reevaluation of current symptoms. Discussed the need to return to the ER if symptoms persist/worsen, or for any  questions/concerns that arise at home.    Signature Line     Electronically Signed on 10/23/2019 05:14 PM EST   ________________________________________________   REENA MORT C      Electronically Signed on 11/05/2019 08:25 AM EST   ________________________________________________   LINE-MD,  CHRISTOPHER C            Modified by: REENA MORT C on 10/23/2019 04:30 PM EST      Modified by: CALDWELL-PAC,  KELSEY C on 10/23/2019 05:10 PM EST      Modified by: CALDWELL-PAC,  KELSEY C on 10/23/2019 05:14 PM EST

## 2019-10-25 LAB — CULTURE, URINE: FINAL REPORT: NO GROWTH

## 2020-01-23 NOTE — ED Notes (Signed)
ED Patient Summary              [facility] Emergency Department  [address] [place], [place] [address]  [phone]  Discharge Instructions (Patient)  _______________________________________     Name: Wendy Gentry, Wendy Gentry  DOB:  04/26/1996                   MRN: [id number]                   FIN: [id number]  Reason For Visit: Abdominal pain - Pregnancy; 24 WKS PREG WITH CONTRACTIONS  Final Diagnosis: Preterm labor in second trimester without delivery     Visit Date: 01/23/2020 11:12:00  Address: [address] [place] [address]  Phone: [phone]     Emergency Department Providers:         Primary Physician:   Teodora Medici [facility] would like to thank you for allowing [place] to assist you with your healthcare needs. The following includes patient education materials and information regarding your injury/illness.     Follow-up Instructions:  You were seen today on an emergency basis. Please contact your primary care doctor for a follow up appointment. If you received a referral to a specialist doctor, it is important you follow-up as instructed.    It is important that you call your follow-up doctor to schedule and confirm the location of your next appointment. Your doctor may practice at multiple locations. The office location of your follow-up appointment may be different to the one written on your discharge instructions.    If you do not have a primary care doctor, please call [phone] for help in finding a [facility] Provider. For help in finding a specialist doctor, please call [phone].    The [facility] "Ask a Nurse" line in staffed by Registered Nurses and is a free service to the community. We are available Monday - Friday from 8am to 5pm to answer your questions about your health. Please call [phone].    If your condition gets worse before your follow-up with your primary care doctor or specialist, please  return to the Emergency Department.      Coronavirus 2019 (COVID-19) Reminders:     Patients 65 and older can contact their [facility] doctors' offices to schedule appointments to receive the COVID-19 vaccine at the [facility] or send [place] an email at [email or web address].            Scan this code with your phone camera to send an email to the address above.      Patients who are 86 and older who do not have a [facility] physician can call [phone] to schedule vaccination appointments.         Follow Up Appointments:  Primary Care Provider:      Name: PCP,  NONE      Phone:           Printed Prescriptions:    Patient Education Materials:  Discharge Orders       Comment:             Allergy Info: No Known Medication Allergies     Medication Information:  [facility] ED Physicians provided you with a complete list of medications post discharge, if you have been instructed to stop taking a medication please ensure you also  follow up with this information to your Primary Care Physician.  Unless otherwise noted, patient will continue to take medications as prescribed prior to the Emergency Room visit.  Any specific questions regarding your chronic medications and dosages should be discussed with your physician(s) and pharmacist.          No Known Home Medications      Medications Administered During Visit:       Major Tests and Procedures:  The following procedures and tests were performed during your ED visit.  COMMON PROCEDURES%>  COMMON PROCEDURES COMMENTS%>          Laboratory Orders  No laboratory orders were placed.              Radiology Orders  No radiology orders were placed.              Patient Care Orders  Name Status Details   ED Assessment Adult Ordered 01/23/20 11:22:43 EST, 01/23/20 11:22:43 EST   ED Secondary Triage Completed 01/23/20 11:22:43 EST, 01/23/20 11:22:43 EST   ED Triage Adult Completed 01/23/20 11:12:26 EST, 01/23/20 11:12:26 EST   Fetal  Heart Tones Completed 01/23/20 11:15:00 EST, Once, 01/23/20 11:15:00 EST   Transport Patient Ordered 01/23/20 11:32:00 EST       ---------------------------------------------------------------------------------------------------------------------  [facility]) encourages you to self-enroll in the [facility] Patient Portal.  [facility] Patient Portal will allow you to manage your personal health information securely from your own electronic device now and in the future.  To begin your Patient Portal enrollment process, please visit [email or web address] Click on "Sign up now" under [facility].  If you find that you need additional assistance on the [facility] Patient Portal or need a copy of your medical records, please call the [facility] Medical Records Office at [phone].  Comment:

## 2020-01-23 NOTE — ED Provider Notes (Signed)
Abdominal pain - Pregnancy        Patient:   Wendy Gentry, Wendy Gentry             MRN: [id number]            FIN: [id number]               Age:   24 years     Sex:  Female     DOB:  June 07, 1996   Associated Diagnoses:   Preterm labor in second trimester without delivery   Author:   Skip Mayer      Basic Information   Additional information: Chief Complaint from Nursing Triage Note   Chief Complaint   No qualifying data available.Marland Kitchen      History of Present Illness   The patient presents with abdominal pain during pregnancy.  The onset was 2  days ago.  The course/duration of symptoms is fluctuating in intensity.  The location is suprapubic area.  The character of symptoms is achy.  The degree at onset was moderate.  The degree at present is minimal.  Radiating pain: none. Pregnancy Status Gravida: 2, Para: 1 24 weeks.  The exacerbating factor is movement.  The relieving factor is rest.  Risk factors consist of preeclampsia.  Prior episodes: occasional.  Therapy today: see nurses notes.  Pregnancy symptoms no fetal movement, no vaginal bleeding no vaginal discharge.  Associated symptoms: denies nausea and denies fever.        Review of Systems   Constitutional symptoms:  No fever, no chills.    Gastrointestinal symptoms:  Abdominal pain, mild, cramping, pressure.    Genitourinary symptoms:  No dysuria, no vaginal discharge.              Additional review of systems information: All other systems reviewed and otherwise negative.      Health Status   Allergies:    Allergic Reactions (Selected)  No Known Medication Allergies.      Past Medical/ Family/ Social History   Medical history: Reviewed as documented in chart.   Surgical history: Reviewed as documented in chart.   Family history: Not significant.   Social history:    Social & Psychosocial Habits    Alcohol  12/20/2015  Use: Denies    Tobacco  12/20/2015  Use: Never smoker  , Reviewed as documented in chart.   Problem list:    Active Problems (3)  Anxiety   Asthma    Preeclampsia   .      Physical Examination               Vital Signs   Oxygen saturation.   General:  Alert, no acute distress.    Skin:  Warm, dry, pink.    Head:  Normocephalic.   Neck:  Supple, trachea midline.    Eye:  Pupils are equal, round and reactive to light, extraocular movements are intact.    Ears, nose, mouth and throat:  Oral mucosa moist.   Cardiovascular:  Regular rate and rhythm.   Respiratory:  Lungs are clear to auscultation, respirations are non-labored.    Back:  Normal range of motion.   Musculoskeletal:  Normal ROM, no tenderness.    Chest wall   Gastrointestinal:  Soft, Nontender, Normal bowel sounds, No organomegaly, gravid.    Neurological:  Alert and oriented to person, place, time, and situation, No focal neurological deficit observed, normal sensory observed, normal motor observed.    Lymphatics:  No lymphadenopathy.  Psychiatric:  Cooperative, appropriate mood & affect.       Medical Decision Making   Documents reviewed:  Emergency department nurses' notes.      Reexamination/ Reevaluation   Course: improving.   Pain status: decreased.   Assessment: exam improved.   Notes: Heart tones, will discuss with [facility] laborist for transfer, Fetal heart tones 142.      Impression and Plan   Diagnosis   Preterm labor in second trimester without delivery (ICD10-CM O60.02, Discharge, Medical)   Plan   Condition: Stable.    Disposition: Transfer to other location: Time: 01/23/2020 11:23:00, Facility name: [facility].    Counseled: Patient, Regarding diagnosis, Regarding diagnostic results, Regarding treatment plan, I had a detailed discussion with the patient and/or guardian regarding the historical points/exam findings supporting the discharge diagnosis and need for outpatient followup. Discussed the need to return to the ER if symptoms persist/worsen, or for any questions/concerns that arise at home, Patient indicated understanding of instructions.    Signature Line     Electronically Signed on  01/23/2020 11:26 AM EST   ________________________________________________   Merlinda Frederick               Modified by: Merlinda Frederick on 01/23/2020 11:26 AM EST

## 2020-01-23 NOTE — Nursing Note (Signed)
Formatting of this note might be different from the original.  Neg FFN. MD notified    Electronically signed by Wilford Sports, RN at 01/23/2020  2:30 PM EST

## 2020-01-23 NOTE — Nursing Note (Signed)
Formatting of this note might be different from the original.  23yo G3P1 with c/o ctxns that dont come and go but are "just there". Denies VB, LOF. Endorses good FM.   Electronically signed by Wilford Sports, RN at 01/23/2020 12:40 PM EST

## 2020-01-23 NOTE — ED Notes (Signed)
ED Patient Education Note     Patient Education Materials Follows:

## 2020-01-23 NOTE — Progress Notes (Signed)
Formatting of this note is different from the original.  L&D Exam Room Visit  [facility] [place]    History of Present Illness   23 y.o.  [phone] @ [redacted]w[redacted]d who presents with contractions.    Pt has had contractions on and off for the past day. The pain is on her bilateral upper abdomen. She presented to [facility] ED who transferred her here. They did not do a pelvic exam. She denies LOF or VB. Good FM. No n/v, diarrhea. Regular BMs. No dysuria or abnormal vagina discharge.    ROS:  General: no weakness, no fevers or chills  CV: no chest pain or heart palpitations  Resp: no cough or SOB  Abdomen: no constipation, diarrhea, nausea, or vomiting; +abdominal pain  Musculoskeletal: no joint pain or ROM limitations  Gyn: no dysuria, discharge, or VB  Extremities: no change in edema or erythema    Physical Exam   BP 133/74   Pulse 84   Temp 36.8 C (98.2 F)   Resp 18   Gen: No acute distress  Abd: soft, gravid, nontender.  No rebound or guarding.  Pelvic:   - SSE: deferred  - SVE: 1/L/H/firm/post > unchanged 2 hrs later  Ext: trace bilateral LE edema    Fetal Testing:  Baseline FHR: 150 per minute  Fetal heart variability: moderate  Fetal Heart Rate accelerations: yes  Fetal Heart Rate decelerations: single variable  Uterine contractions: none  Fetal surveillance: reassuring     Labs/Imaging:  Wet Prep: Negative for Clue cells,Trichomonads, yeast.  Urine dip neg  Results for orders placed or performed during the hospital encounter of 01/23/20   FETAL FIBRONECTIN   Result Value Ref Range    FETAL FIBRONECTIN Negative Negative   Gen probe pending  UA/UCx pending      Assessment and Plan   23 y.o. [id number] at [redacted]w[redacted]d who presents with false labor    1. False labor  - Reports irregular ctx, none traced on toco. Pt spontaneously improved without intervention  - Cervix not concerning for labor, no change after 2 hours, FFN neg  - FSR    2. Transfer of care  - Reports [facility] since 7wga in [place], last seen at the end of Jan;  has now moved here  - Referral placed and message sent to schedulers  - Records release signed    Patient discussed with Dr. Ethelene Hal who agreed with my plan.    Thressa Sheller Saums  Pager: [phone]  Date: 01/23/2020  Time: 4:27 PM     ATTENDING ATTESTATION    I was readily available and discussed this patient with the resident and agree with the findings, assessment, and plan as written. Patient presents with contractions. Ruled out for labor. Given precautions. Discharge to home.    Willeen Niece, MD  Maternal-Fetal Medicine      Electronically signed by Marchia Meiers, MD at 02/02/2020  8:26 AM EST

## 2020-01-23 NOTE — ED Notes (Signed)
ED Triage Note       ED Secondary Triage Entered On:  01/23/2020 11:23 EST    Performed On:  01/23/2020 11:22 EST by Dara Lords, RN, Gwenyth Bender               General Information   Barriers to Learning :   None evident   ED Home Meds Section :   Document assessment   [facility] ED Fall Risk Section :   Document assessment   ED Advance Directives Section :   Document assessment   ED Palliative Screen :   N/A (prefilled for <22yo)   Dara Lords, RN, Darcy A - 01/23/2020 11:22 EST   (As Of: 01/23/2020 11:23:19 EST)   Problems(Active)    Anxiety (IMO  :84132 )  Name of Problem:   Anxiety ; Recorder:   Tori Milks; Confirmation:   Confirmed ; Classification:   Medical ; Code:   [phone] ; Contributor System:   Conservation officer, nature ; Last Updated:   12/20/2015 19:07 EST ; Life Cycle Date:   12/20/2015 ; Life Cycle Status:   Active ; Vocabulary:   IMO        Asthma (SNOMED CT  :272536644 )  Name of Problem:   Asthma ; Recorder:   Tori Milks; Confirmation:   Confirmed ; Classification:   Medical ; Code:   [phone] ; Contributor System:   Conservation officer, nature ; Last Updated:   12/20/2015 19:07 EST ; Life Cycle Date:   12/20/2015 ; Life Cycle Status:   Active ; Vocabulary:   SNOMED CT        Preeclampsia (SNOMED CT  :6387564332 )  Name of Problem:   Preeclampsia ; Recorder:   Dara Lords, RN, Acupuncturist A; Confirmation:   Confirmed ; Classification:   Patient Stated ; Code:   [phone] ; Contributor System:   Conservation officer, nature ; Last Updated:   10/23/2019 15:25 EST ; Life Cycle Date:   10/23/2019 ; Life Cycle Status:   Active ; Vocabulary:   SNOMED CT          Diagnoses(Active)    Abdominal pain - Pregnancy  Date:   01/23/2020 ; Diagnosis Type:   Reason For Visit ; Confirmation:   Complaint of ; Clinical Dx:   Abdominal pain - Pregnancy ; Classification:   Medical ; Clinical Service:   Emergency medicine ; Code:   PNED ; Probability:   0 ; Diagnosis Code:   [phone]             -    Procedure History   (As Of: 01/23/2020 11:23:19 EST)      Anesthesia Minutes:   0 ; Procedure Name:   None ; Procedure Minutes:   0            [facility] Fall Risk Assessment Tool   Hx of falling last 3 months ED Fall :   No   Patient confused or disoriented ED Fall :   No   Patient intoxicated or sedated ED Fall :   No   Patient impaired gait ED Fall :   No   Use a mobility assistance device ED Fall :   No   Patient altered elimination ED Fall :   No   [facility] ED Fall Score :   0    Dara Lords RN, Salomon Fick A - 01/23/2020 11:22 EST   ED Advance Directive   Advance Directive :   No   Dara Lords, RN, Salomon Fick A - 01/23/2020 11:22 EST  Med Hx   Medication List   (As Of: 01/23/2020 11:23:19 EST)   No Known Home Medications     Julien Girt, RN, Jewel Baize A - 01/23/2020 11:23:15

## 2020-01-23 NOTE — ED Notes (Signed)
 ED Triage Note       ED Triage Adult Entered On:  01/23/2020 11:22 EST    Performed On:  01/23/2020 11:18 EST by Lanell, RN, Darcy A               Triage   Chief Complaint :   [redacted] weeks pregnant with c/o contractions. G2P1   Numeric Rating Pain Scale :   9   [place] Mode of Arrival :   Private vehicle   Infectious Disease Documentation :   Document assessment   Temperature Oral :   37.2 degC(Converted to: 99.0 degF)    Heart Rate Monitored :   88 bpm   Respiratory Rate :   18 br/min   Systolic Blood Pressure :   124 mmHg   Diastolic Blood Pressure :   63 mmHg   SpO2 :   100 %   Oxygen Therapy :   Room air   Patient presentation :   None of the above   Chief Complaint or Presentation suggest infection :   No   Weight Dosing :   82 kg(Converted to: 180 lb 12 oz)    Height :   158 cm(Converted to: 5 ft 2 in)    Body Mass Index Dosing :   33 kg/m2   Lanell, RN, Gilda A - 01/23/2020 11:18 EST   DCP GENERIC CODE   Tracking Acuity :   2   Tracking Group :   ED [employer]   [place], RN, Programme researcher, broadcasting/film/video A - 01/23/2020 11:18 EST   ED General Section :   Document assessment   Pregnancy Status :   Confirmed Pregnancy   ED Allergies Section :   Document assessment   ED Reason for Visit Section :   Document assessment   ED Quick Assessment :   Patient appears awake, alert, oriented to baseline. Skin warm and dry. Moves all extremities. Respiration even and unlabored. Appears in no apparent distress.   Lanell, RN, Programme researcher, broadcasting/film/video A - 01/23/2020 11:18 EST   ID Risk Screen Symptoms   Recent Travel History :   No recent travel   Close Contact with COVID-19 ID :   No   Last 14 days COVID-19 ID :   No   TB Symptom Screen :   No symptoms   C. diff Symptom/History ID :   Neither of the above   Lanell, RN, Gilda A - 01/23/2020 11:18 EST   Allergies   (As Of: 01/23/2020 11:22:42 EST)   Allergies (Active)   No Known Medication Allergies  Estimated Onset Date:   Unspecified ; Created By:   Newton Proffer; Reaction Status:   Active ; Category:    Drug ; Substance:   No Known Medication Allergies ; Type:   Allergy ; Updated By:   Newton Proffer; Reviewed Date:   01/23/2020 11:20 EST        Psycho-Social   Last 3 mo, thoughts killing self/others :   Patient denies   ED Behavioral Activity Rating Scale :   4 - Quiet and awake (normal level of activity)   Lanell, RN, Gilda LABOR - 01/23/2020 11:18 EST   ED Reason for Visit   (As Of: 01/23/2020 11:22:42 EST)   Problems(Active)    Anxiety (IMO  :61586 )  Name of Problem:   Anxiety ; Recorder:   Newton Proffer; Confirmation:   Confirmed ; Classification:   Medical ; Code:   [phone] ;  Contributor System:   PowerChart ; Last Updated:   12/20/2015 19:07 EST ; Life Cycle Date:   12/20/2015 ; Life Cycle Status:   Active ; Vocabulary:   IMO        Asthma (SNOMED CT  :698514988 )  Name of Problem:   Asthma ; Recorder:   Newton Proffer; Confirmation:   Confirmed ; Classification:   Medical ; Code:   [phone] ; Contributor System:   PowerChart ; Last Updated:   12/20/2015 19:07 EST ; Life Cycle Date:   12/20/2015 ; Life Cycle Status:   Active ; Vocabulary:   SNOMED CT        Preeclampsia (SNOMED CT  :8222197982 )  Name of Problem:   Preeclampsia ; Recorder:   Lanell, RN, Programme researcher, broadcasting/film/video A; Confirmation:   Confirmed ; Classification:   Patient Stated ; Code:   [phone] ; Contributor System:   PowerChart ; Last Updated:   10/23/2019 15:25 EST ; Life Cycle Date:   10/23/2019 ; Life Cycle Status:   Active ; Vocabulary:   SNOMED CT          Diagnoses(Active)    Abdominal pain - Pregnancy  Date:   01/23/2020 ; Diagnosis Type:   Reason For Visit ; Confirmation:   Complaint of ; Clinical Dx:   Abdominal pain - Pregnancy ; Classification:   Medical ; Clinical Service:   Emergency medicine ; Code:   PNED ; Probability:   0 ; Diagnosis Code:   [phone]

## 2020-01-23 NOTE — Discharge Summary (Signed)
ED Clinical Summary                        [facility] [place] [address]  [phone]           PERSON INFORMATION  Name: Wendy Gentry, Wendy Gentry Age:  24 Years DOB: 12/06/1995   Sex: Female Language: English PCP: PCP,  NONE   Marital Status: Single Phone: [phone] Med Service: MED-Medicine   MRN: [id number] Acct# [id number] Arrival: 01/23/2020 11:12:00   Visit Reason: Abdominal pain - Pregnancy; 24 WKS PREG WITH CONTRACTIONS Acuity: 2 LOS: 000 00:52   Address:    [address] [place] [address]   Diagnosis:    Preterm labor in second trimester without delivery  Medications:    Medications Administered During Visit:              Allergies      No Known Medication Allergies      Major Tests and Procedures:  The following procedures and tests were performed during your ED visit.  COMMON PROCEDURES%>  COMMON PROCEDURES COMMENTS%>                PROVIDER INFORMATION               Provider Role Assigned Eleonore Chiquito [facility] ED Provider 01/23/2020 11:16:21    Hassell Done, RN, Doneen Poisson ED Nurse 01/23/2020 11:25:48        Attending Physician:  Skip Mayer      Admit Doc  LEWIS-MD,  Loma Newton     Consulting Doc       VITALS INFORMATION  Vital Sign Triage Latest   Temp Oral ORAL_1%> ORAL%>   Temp Temporal TEMPORAL_1%> TEMPORAL%>   Temp Intravascular INTRAVASCULAR_1%> INTRAVASCULAR%>   Temp Axillary AXILLARY_1%> AXILLARY%>   Temp Rectal RECTAL_1%> RECTAL%>   02 Sat 100 % 100 %   Respiratory Rate RATE_1%> RATE%>   Peripheral Pulse Rate PULSE RATE_1%> PULSE RATE%>   Apical Heart Rate HEART RATE_1%> HEART RATE%>   Blood Pressure BLOOD PRESSURE_1%>/ BLOOD PRESSURE_1%>63 mmHg BLOOD PRESSURE%> / BLOOD PRESSURE%>63 mmHg                 Immunizations      No Immunizations Documented This Visit          DISCHARGE INFORMATION   Discharge Disposition: [place]   Discharge Location:  [facility]   Discharge Date and Time:  01/23/2020 12:04:45   ED Checkout  Date and Time:  01/23/2020 12:04:45     DEPART REASON INCOMPLETE INFORMATION               Depart Action Incomplete Reason   Interactive View/I&O Recently assessed   Patient Education MD Decision to leave incomplete   Follow-up MD Decision to leave incomplete               Problems      Active           Anxiety          Asthma              Smoking Status      Never smoker         PATIENT EDUCATION INFORMATION  Instructions:          Follow up:            ED PROVIDER DOCUMENTATION  Patient:   Wendy Gentry, Wendy Gentry             MRN: [id number]            FIN: [id number]               Age:   24 years     Sex:  Female     DOB:  [birth year]   Associated Diagnoses:   Preterm labor in second trimester without delivery   Author:   Merlinda Frederick      Basic Information   Additional information: Chief Complaint from Nursing Triage Note   Chief Complaint   No qualifying data available.Marland Kitchen      History of Present Illness   The patient presents with abdominal pain during pregnancy.  The onset was 2  days ago.  The course/duration of symptoms is fluctuating in intensity.  The location is suprapubic area.  The character of symptoms is achy.  The degree at onset was moderate.  The degree at present is minimal.  Radiating pain: none. Pregnancy Status Gravida: 2, Para: 1 24 weeks.  The exacerbating factor is movement.  The relieving factor is rest.  Risk factors consist of preeclampsia.  Prior episodes: occasional.  Therapy today: see nurses notes.  Pregnancy symptoms no fetal movement, no vaginal bleeding no vaginal discharge.  Associated symptoms: denies nausea and denies fever.        Review of Systems   Constitutional symptoms:  No fever, no chills.    Gastrointestinal symptoms:  Abdominal pain, mild, cramping, pressure.    Genitourinary symptoms:  No dysuria, no vaginal discharge.              Additional review of systems information: All other systems reviewed and otherwise negative.      Health Status   Allergies:    Allergic  Reactions (Selected)  No Known Medication Allergies.      Past Medical/ Family/ Social History   Medical history: Reviewed as documented in chart.   Surgical history: Reviewed as documented in chart.   Family history: Not significant.   Social history:    Social & Psychosocial Habits    Alcohol  12/20/2015  Use: Denies    Tobacco  12/20/2015  Use: Never smoker  , Reviewed as documented in chart.   Problem list:    Active Problems (3)  Anxiety   Asthma   Preeclampsia   .      Physical Examination               Vital Signs   Oxygen saturation.   General:  Alert, no acute distress.    Skin:  Warm, dry, pink.    Head:  Normocephalic.   Neck:  Supple, trachea midline.    Eye:  Pupils are equal, round and reactive to light, extraocular movements are intact.    Ears, nose, mouth and throat:  Oral mucosa moist.   Cardiovascular:  Regular rate and rhythm.   Respiratory:  Lungs are clear to auscultation, respirations are non-labored.    Back:  Normal range of motion.   Musculoskeletal:  Normal ROM, no tenderness.    Chest wall   Gastrointestinal:  Soft, Nontender, Normal bowel sounds, No organomegaly, gravid.    Neurological:  Alert and oriented to person, place, time, and situation, No focal neurological deficit observed, normal sensory observed, normal motor observed.    Lymphatics:  No lymphadenopathy.   Psychiatric:  Cooperative, appropriate mood & affect.  Medical Decision Making   Documents reviewed:  Emergency department nurses' notes.      Reexamination/ Reevaluation   Course: improving.   Pain status: decreased.   Assessment: exam improved.   Notes: Heart tones, will discuss with [facility] laborist for transfer, Fetal heart tones 142.      Impression and Plan   Diagnosis   Preterm labor in second trimester without delivery (ICD10-CM O60.02, Discharge, Medical)   Plan   Condition: Stable.    Disposition: Transfer to other location: Time: 01/23/2020 11:23:00, Facility name: [facility].    Counseled: Patient, Regarding  diagnosis, Regarding diagnostic results, Regarding treatment plan, I had a detailed discussion with the patient and/or guardian regarding the historical points/exam findings supporting the discharge diagnosis and need for outpatient followup. Discussed the need to return to the ER if symptoms persist/worsen, or for any questions/concerns that arise at home, Patient indicated understanding of instructions.

## 2020-01-25 LAB — CBC
Hematocrit: 32.2 % — ABNORMAL LOW (ref 34.0–47.0)
Hemoglobin: 10.3 g/dL — ABNORMAL LOW (ref 11.5–15.7)
MCH: 24.8 pg — ABNORMAL LOW (ref 27.0–34.5)
MCHC: 32 g/dL (ref 32.0–36.0)
MCV: 77.6 fL — ABNORMAL LOW (ref 81.0–99.0)
MPV: 11.7 fL (ref 7.2–13.2)
NRBC Absolute: 0 10*3/uL (ref 0.000–0.012)
NRBC Automated: 0 % (ref 0.0–0.2)
Platelets: 274 10*3/uL (ref 140–440)
RBC: 4.15 x10e6/mcL (ref 3.60–5.20)
RDW: 14.7 % (ref 11.0–16.0)
WBC: 10 10*3/uL (ref 3.8–10.6)

## 2020-01-25 LAB — URINALYSIS WITH REFLEX TO CULTURE
Bilirubin Urine: NEGATIVE
Blood, Urine: NEGATIVE
Glucose, UA: NEGATIVE mg/dL
Ketones, Urine: NEGATIVE mg/dL
Leukocyte Esterase, Urine: NEGATIVE
Nitrite, Urine: NEGATIVE
Protein, UA: NEGATIVE
Specific Gravity, UA: 1.02 (ref 1.003–1.035)
Urobilinogen, Urine: 0.2 EU/dL
pH, UA: 8.5 (ref 4.5–8.0)

## 2020-01-25 LAB — COMPREHENSIVE METABOLIC PANEL
ALT: 8 U/L (ref 0–33)
AST: 21 U/L (ref 0–32)
Albumin/Globulin Ratio: 1.23 mmol/L (ref 1.00–2.00)
Albumin: 3.8 g/dL (ref 3.5–5.2)
Alk Phosphatase: 107 U/L (ref 35–117)
Anion Gap: 9 mmol/L (ref 2–17)
BUN: 4 mg/dL — ABNORMAL LOW (ref 6–20)
CO2: 22 mmol/L (ref 22–29)
Calcium: 9.3 mg/dL (ref 8.6–10.0)
Chloride: 100 mmol/L (ref 98–107)
Creatinine: 0.4 mg/dL — ABNORMAL LOW (ref 0.5–1.0)
GFR African American: 173 mL/min/{1.73_m2} (ref >=90)
GFR Non-African American: 149 mL/min/{1.73_m2} (ref >=90)
Globulin: 3.1 g/dL (ref 1.9–4.4)
Glucose: 99 mg/dL (ref 70–99)
OSMOLALITY CALCULATED: 259 mOsm/kg — ABNORMAL LOW (ref 270–287)
Sodium: 131 mmol/L — ABNORMAL LOW (ref 135–145)
Total Bilirubin: <0.15 mg/dL (ref 0.00–1.20)
Total Protein: 7 g/dL (ref 6.4–8.3)

## 2020-01-25 LAB — URINE DRUG SCREEN
Amphetamine Screen, Urine: NEGATIVE
Barbiturate Screen, Ur: NEGATIVE
Benzodiazepine Screen, Urine: NEGATIVE
Cannabinoid Scrn, Ur: NEGATIVE
Cocaine Screen Urine: NEGATIVE
Opiate Screen, Urine: NEGATIVE

## 2020-01-25 NOTE — ED Notes (Signed)
 ED Patient Summary       ;          [facility]  [address]  [place], [place] [address]  [phone]  Patient Discharge Instructions  Name: Wendy Gentry, Wendy Gentry  Current Date: 01/25/2020 11:58:56  DOB: June 29, 1996 MRN: [id number] FIN: NBR%[phone]  Patient Address: [address] [place] [address]  Patient Phone: [phone]  Primary Care Provider:  Name: PCP,  NONE  Phone:   Immunizations Provided:   Discharge Diagnosis: [redacted] weeks gestation of pregnancy; Nausea and vomiting in pregnancy  Discharged To: ANTICIPATED%>  Home Treatments: ANTICIPATED%>  Devices/Equipment: REHAB%>  Post Hospital Services: HOSPITAL SERVICES%>  Professional Skilled Services: SKILLED SERVICES%>  Therapist, sports and Community Resources: SERV AND COMM RES, ANTICIPATED%>  Mode of Discharge Transportation: TRANSPORTATION%>Private vehicle  Discharge Orders          Discharge Patient 01/25/20 11:50:00 EST, Discharge Home/Self Care, return to Ob/ED for worsening symptoms, CTX, LOF, VB, decreased fetal movement or any acute change.      Comment:   Medications  During the course of your visit, your medication list was updated with the most current information. The details of those changes are reflected below:          New Medications  Other Medications  acetaminophen  (acetaminophen  500 mg oral tablet) 2 Tabs Oral (given by mouth) every 6 hours as needed mild pain (1-3).  Last Dose:____________________  Medications that have not changed  Other Medications  multivitamin, prenatal (Classic Prenatal) Oral Daily.  Last Dose:____________________  No Longer Take the Following Medications  ondansetron  (Zofran  4 mg oral tablet) 1 Tabs Oral (given by mouth) every 8 hours.  Stop Taking Reason: Physician Request      [facility] would like to thank you for allowing us  to assist you with your healthcare needs. The following includes patient education materials and information regarding your  injury/illness.  Zoeller, Librada has been given the following list of follow-up instructions, prescriptions, and patient education materials:  Follow-up Instructions:         It is important to always keep an active list of medications available so that you can share with other providers and manage your medications appropriately. As an additional courtesy, we are also providing you with your final active medications list that you can keep with you.          acetaminophen  (acetaminophen  500 mg oral tablet) 2 Tabs Oral (given by mouth) every 6 hours as needed mild pain (1-3).  multivitamin, prenatal (Classic Prenatal) Oral Daily.      Take only the medications listed above. Contact your doctor prior to taking any medications not on this list.  Discharge instructions, if any, will display below  Instructions for Diet: FOR DIET%>  Instructions for Supplements: SUPPLEMENT INSTRUCTIONS%>  Instructions for Activity: INSTRUCTIONS FOR ACTIVITY%>  Instructions for Wound Care: INSTRUCTIONS FOR WOUND CARE%>  Medication leaflets, if any, will display below    Patient education materials, if any, will display below     Nausea, Adult    Nausea is the feeling that you have an upset stomach or have to vomit. Nausea by itself is not likely a serious concern, but it may be an early sign of more serious medical problems. As nausea gets worse, it can lead to vomiting. If vomiting develops, there is the risk of dehydration.       CAUSES     Viral infections.     Food poisoning.  Medicines.     Pregnancy.     Motion sickness.     Migraine headaches.     Emotional distress.     Severe pain from any source.     Alcohol intoxication.     HOME CARE INSTRUCTIONS     Get plenty of rest.      Ask your caregiver about specific rehydration instructions.     Eat small amounts of food and sip liquids more often.     Take all medicines as told by your caregiver.     SEEK MEDICAL CARE IF:     You have not improved after 2 days, or you get worse.      You have a headache.    SEEK IMMEDIATE MEDICAL CARE IF:     You have a fever.      You faint.     You keep vomiting or have blood in your vomit.     You are extremely weak or dehydrated.     You have dark or bloody stools.     You have severe chest or abdominal pain.     MAKE SURE YOU:     Understand these instructions.     Will watch your condition.     Will get help right away if you are not doing well or get worse.    This information is not intended to replace advice given to you by your health care provider. Make sure you discuss any questions you have with your health care provider.    Document Released: 12/29/2004 Document Revised: 12/12/2014 Document Reviewed: 07/28/2015  Elsevier Interactive Patient Education ?2016 Elsevier Inc.       Prenatal Care    WHAT IS PRENATAL CARE?    Prenatal care is the process of caring for a pregnant woman before she gives birth. Prenatal care makes sure that she and her baby remain as healthy as possible throughout pregnancy. Prenatal care may be provided by a midwife, family practice health care provider, or a childbirth and pregnancy specialist (obstetrician). Prenatal care may include physical examinations, testing, treatments, and education on nutrition, lifestyle, and social support services.    WHY IS PRENATAL CARE SO IMPORTANT?    Early and consistent prenatal care increases the chance that you and your baby will remain healthy throughout your pregnancy. This type of care also decreases a baby's risk of being born too early (prematurely), or being born smaller than expected (small for gestational age). Any underlying medical conditions you may have that could pose a risk during your pregnancy are discussed during prenatal care visits. You will also be monitored regularly for any new conditions that may arise during your pregnancy so they can be treated quickly and effectively.    WHAT HAPPENS DURING PRENATAL CARE VISITS?    Prenatal care visits may include the  following:    Discussion    Tell your health care provider about any new signs or symptoms you have experienced since your last visit. These might include:     Nausea or vomiting.     Increased or decreased level of energy.     Difficulty sleeping.     Back or leg pain.     Weight changes.     Frequent urination.     Shortness of breath with physical activity.     Changes in your skin, such as the development of a rash or itchiness.     Vaginal discharge or bleeding.  Feelings of excitement or nervousness.     Changes in your baby's movements.    You may want to write down any questions or topics you want to discuss with your health care provider and bring them with you to your appointment.    Examination    During your first prenatal care visit, you will likely have a complete physical exam. Your health care provider will often examine your vagina, cervix, and the position of your uterus, as well as check your heart, lungs, and other body systems. As your pregnancy progresses, your health care provider will measure the size of your uterus and your baby's position inside your uterus. He or she may also examine you for early signs of labor. Your prenatal visits may also include checking your blood pressure and, after about 10?12 weeks of pregnancy, listening to your baby's heartbeat.    Testing    Regular testing often includes:     Urinalysis. This checks your urine for glucose, protein, or signs of infection.     Blood count. This checks the levels of white and red blood cells in your body.     Tests for sexually transmitted infections (STIs). Testing for STIs at the beginning of pregnancy is routinely done and is required in many states.      Antibody testing. You will be checked to see if you are immune to certain illnesses, such as rubella, that can affect a developing fetus.     Glucose screen. Around 24?28 weeks of pregnancy, your blood glucose level will be checked for signs of gestational diabetes.  Follow-up tests may be recommended.     Group B strep. This is a bacteria that is commonly found inside a woman's vagina. This test will inform your health care provider if you need an antibiotic to reduce the amount of this bacteria in your body prior to labor and childbirth.     Ultrasound. Many pregnant women undergo an ultrasound screening around 18?20 weeks of pregnancy to evaluate the health of the fetus and check for any developmental abnormalities.     HIV (human immunodeficiency virus) testing. Early in your pregnancy, you will be screened for HIV. If you are at high risk for HIV, this test may be repeated during your third trimester of pregnancy.    You may be offered other testing based on your age, personal or family medical history, or other factors.     HOW OFTEN SHOULD I PLAN TO SEE MY HEALTH CARE PROVIDER FOR PRENATAL CARE?    Your prenatal care check-up schedule depends on any medical conditions you have before, or develop during, your pregnancy. If you do not have any underlying medical conditions, you will likely be seen for checkups:     Monthly, during the first 6 months of pregnancy.     Twice a month during months 7 and 8 of pregnancy.     Weekly starting in the 9th month of pregnancy and until delivery.    If you develop signs of early labor or other concerning signs or symptoms, you may need to see your health care provider more often. Ask your health care provider what prenatal care schedule is best for you.    WHAT CAN I DO TO KEEP MYSELF AND MY BABY AS HEALTHY AS POSSIBLE DURING MY PREGNANCY?     Take a prenatal vitamin containing 400 micrograms (0.4 mg) of folic acid every day. Your health care provider may also ask you to take additional  vitamins such as iodine, vitamin D, iron, copper, and zinc.     Take 1500?2000 mg of calcium daily starting at your 20th week of pregnancy until you deliver your baby.     Make sure you are up to date on your vaccinations. Unless directed otherwise by  your health care provider:    ? You should receive a tetanus, diphtheria, and pertussis (Tdap) vaccination between the 27th and 36th week of your pregnancy, regardless of when your last Tdap immunization occurred. This helps protect your baby from whooping cough (pertussis) after he or she is born.    ? You should receive an annual inactivated influenza vaccine (IIV) to help protect you and your baby from influenza. This can be done at any point during your pregnancy.     Eat a well-rounded diet that includes:    ? Fresh fruits and vegetables.    ? Lean proteins.    ? Calcium-rich foods such as milk, yogurt, hard cheeses, and dark, leafy greens.    ? Whole grain breads.      Do not?eat seafood high in mercury, including:    ? Swordfish.    ? Tilefish.    ? Shark.    ? King mackerel.    ? More than 6 oz tuna per week.      Do not eat:    ? Raw or undercooked meats or eggs.    ? Unpasteurized foods, such as soft cheeses (brie, blue, or feta), juices, and milks.    ? Lunch meats.     ? Hot dogs that have not been heated until they are steaming.     Drink enough water to keep your urine clear or pale yellow. For many women, this may be 10 or more 8 oz glasses of water each day. Keeping yourself hydrated helps deliver nutrients to your baby and may prevent the start of pre-term uterine contractions.     Do not use any tobacco products including cigarettes, chewing tobacco, or electronic cigarettes. If you need help quitting, ask your health care provider.     Do not drink beverages containing alcohol. No safe level of alcohol consumption during pregnancy has been determined.      Do not use any illegal drugs. These can harm your developing baby or cause a miscarriage.      Ask your health care provider or pharmacist before taking any prescription or over-the-counter medicines, herbs, or supplements.     Limit your caffeine intake to no more than 200 mg per day.     Exercise. Unless told otherwise by your health care  provider, try to get 30 minutes of moderate exercise most days of the week. Do not  do high-impact activities, contact sports, or activities with a high risk of falling, such as horseback riding or downhill skiing.     Get plenty of rest.     Avoid anything that raises your body temperature, such as hot tubs and saunas.     If you own a cat, do not empty its litter box. Bacteria contained in cat feces can cause an infection called toxoplasmosis. This can result in serious harm to the fetus.     Stay away from chemicals such as insecticides, lead, mercury, and cleaning or paint products that contain solvents.     Do not have any X-rays taken unless medically necessary.     Take a childbirth and breastfeeding preparation class. Ask your health care provider if you need  a referral or recommendation.     This information is not intended to replace advice given to you by your health care provider. Make sure you discuss any questions you have with your health care provider.    Document Released: 11/24/2003 Document Revised: 12/12/2014 Document Reviewed: 02/05/2014  Elsevier Interactive Patient Education ?2016 Elsevier Inc.         IS IT A STROKE? Act FAST and Check for these signs:  FACE          Does the face look uneven?  ARM          Does one arm drift down?  SPEECH     Does their speech sound strange?  TIME          Call 9-1-1 at any sign of stroke  --------------------------------------------------------------------------------------------------------------------------  Heart Attack Signs  Chest discomfort: Most heart attacks involve discomfort in the center of the chest and lasts more than a few minutes, or goes away and comes back. It can feel like uncomfortable pressure, squeezing, fullness or pain.  Discomfort in upper body: Symptoms can include pain or discomfort in one or both arms, back, neck, jaw or stomach.  Shortness of breath: With or without discomfort.  Other signs: Breaking out in a cold sweat, nausea,  or lightheaded.  Remember, MINUTES DO MATTER. If you experience any of these heart attack warning signs, call 9-1-1 to get immediate medical attention!  ---------------------------------------------------------------------------------------------------------------------------  Florie Shelvy [facility]) encourages you to self-enroll in the [facility] Patient Portal.  [facility] Patient Portal will allow you to manage your personal health information securely from your own electronic device now and in the future.  To begin your Patient Portal enrollment process, please visit [email or web address] Click on "Sign up now" under [facility].  If you find that you need additional assistance on the [facility] Patient Portal or need a copy of your medical records, please call the [facility] Medical Records Office at [phone].

## 2020-01-25 NOTE — ED Notes (Signed)
ED Triage Note       OBED Triage Entered On:  01/25/2020 9:45 EST    Performed On:  01/25/2020 9:30 EST by Ellsworth Lennox, RN, [employer] Triage   Barriers to Learning :   None evident   Primary Language :   English   Chief Complaint :   Nausea and Vomiting   Mode of Arrival :   Wheelchair   Infectious Disease Documentation :   Document assessment   Reason For Visit OB :   Nausea/Vomiting   Patient received chemo or biotherapy last 48 hrs? :   No   Dosing Weight Obtained By :   Measured   Weight Dosing :   86.7 kg(Converted to: 191 lb 2 oz)    Height :   158 cm(Converted to: 5 ft 2 in)    Body Mass Index Dosing :   35 kg/m2   Numeric Rating Pain Scale :   0 = No pain   ED Pain Details :   Pain Details   Ellsworth Lennox RN, Chauncey Reading - 01/25/2020 9:30 EST   DCP GENERIC CODE   Tracking Acuity :   4   Tracking Group :   ED OB [address] Tracking Group   [place], RN, Chauncey Reading - 01/25/2020 9:30 EST   ED Allergies Section :   Document assessment   ED Home Meds Section :   Document assessment   Subjective Document Assessment :   Document assessment   OB History Document Assessment :   Document assessment   OB Ante Risk Factors Doc Assess :   Document assessment   OB Prenatal HX Doc Assess :   Document assessment   OB PMH PSH Document Assessment :   Document assessment   ED General Section :   Document assessment   ED History Section :   Document assessment   Immunization Document Assessment :   Document assessment   ED Advance Directives Section :   Document assessment   ED Bloodless Medicine :   Document assessment   ED Valuables / Belongings Sections :   Document assessment   ED Assessment Section :   Document assessment   Ellsworth Lennox, RN, Chauncey Reading - 01/25/2020 9:30 EST   ID Risk Screen Symptoms   Recent Travel History :   No recent travel   Close Contact with COVID-19 ID :   No   Last 14 days COVID-19 ID :   No   TB Symptom Screen :   No symptoms   C. diff Symptom/History ID :   Neither of the above   Patient Pregnant  :   None of the above   MRSA/VRE Screening :   None of these apply   CRE Screening :   No   Ellsworth Lennox, RN, Chauncey Reading - 01/25/2020 9:30 EST   Pain Assessment   Preferred Pain Tool :   Numeric rating scale   Numeric Rating With Activity :   0 = No pain   Numeric Rating Score With Activity :   0    Sacilowski, RN, Chauncey Reading - 01/25/2020 9:30 EST   Image 4 -  Images currently included in the form version of this document have not been included in the text rendition version of the form.   Allergies   (As Of: 01/25/2020 09:45:53 EST)   Allergies (Active)   No Known Medication Allergies  Estimated Onset Date:   Unspecified ; Created By:   Marcellina Millin; Reaction Status:   Active ; Category:   Drug ; Substance:   No Known Medication Allergies ; Type:   Allergy ; Updated By:   Marcellina Millin; Reviewed Date:   01/25/2020 9:33 EST        ED Home Med List   Medication List   (As Of: 01/25/2020 09:45:53 EST)   Home Meds    multivitamin, prenatal  :   multivitamin, prenatal ; Status:   Documented ; Ordered As Mnemonic:   Classic Prenatal ; Simple Display Line:   See Instructions, Oral Daily, 0 Refill(s) ; Catalog Code:   multivitamin, prenatal ; Order Dt/Tm:   01/25/2020 09:34:03 EST          ondansetron  :   ondansetron ; Status:   Documented ; Ordered As Mnemonic:   Zofran 4 mg oral tablet ; Simple Display Line:   4 mg, 1 tabs, Oral, q8hr, 0 Refill(s) ; Catalog Code:   ondansetron ; Order Dt/Tm:   01/25/2020 09:34:32 EST            Subjective Data   Chief Complaint/Patient Verbalization of Reason for Admission :   Nausea and vomiting   Reason For Visit OB :   Nausea/Vomiting   Indications for Induction :   Present   Last Fetal Movement Date/Time :   01/25/2020 9:37 EST   Contractions :   Yes   Contraction Frequency (min) :   Since Monday 2/15; intermittant; more frequent in morning   Urge to Push :   No   Bleeding :   No   Leaking Fluid :   No   Intercourse last 24 hours :   No   Colvin Caroli, RN, Cecil Cranker - 01/25/2020 9:30 EST    Obstetrical History   Pregnancy History   (As Of: 01/25/2020 09:45:53 EST)   Gravida - 3, Para Fullterm - , Para Preterm - 1, Abortions - 1, Para Living - 1      Delivery/Outcome Date: 2019   Gestation Age At Birth:   18 Weeks 0 Days ;   Delivery Method:   Vaginal ; Gender:   Female ; Birth Weight:   4lb 13oz / 2183g ; Anesthesia:   Epidural ; Neonate Outcome:   Live Birth ; Comment:   PreEclampsia; GDM; IOL          Delivery/Outcome Date: 06/2019    Delivery Method:   Spontaneous Abortion ; Neonate Outcome:   Fetal Death            Antepartum Risk Factors   Antepartum Risk Factors :   Other: Previous 32week IOL for preeclampsia and GDM (2019)   Reg PC Prior Uterine Surgery vE :   None   Colvin Caroli, RN, Cecil Cranker - 01/25/2020 9:30 EST   Prenatal Info   Primary OB Provider :   [facility]   Prenatal Records Available :   No   Prenatal Care :   None   Date of Last Prenatal Care Visit :   12/28/2019 EST   Blood Type, External :   Unknown   Antibody Screen, External :   Unknown   Rubella, External :   Unknown   RPR, External :   Unknown   Hepatitis B, External :   Unknown   Transcribed, Hepatitis C :   Unknown   HIV Antibodies, External :   Unknown  Gonorrhea, External :   Unknown   Chlamydia, External :   Unknown   Herpes Simplex Virus, External :   Unknown   Tdap Vaccine, External :   No   Toxicology Screen, External :   No   Group B Strep, External :   Unknown   Rhogam, External :   No   Antepartum Steroids, External :   None   Sacilowski, RN, Cecil Cranker - 01/25/2020 9:30 EST   Past Medical History   Past Medical History   (As Of: 01/25/2020 09:45:53 EST)   Pregnant  Name of Problem:   Pregnant ; Onset Date:   04/25/2017 ; Recorder:   Colvin Caroli, RN, Cecil Cranker; Confirmation:   Confirmed ; Classification:   Medical ; Code:   [phone] ; Last Updated:   01/25/2020 9:40 EST ; Life Cycle Date:   Unknown 2019 ; Life Cycle Status:   Resolved ; Vocabulary:   SNOMED CT ; Resolved Date:   Unknown 2019 ; Resolved Age:    57 years          Pregnant  Name of Problem:   Pregnant ; Recorder:   Colvin Caroli, RN, Cecil Cranker; Confirmation:   Confirmed ; Classification:   Medical ; Code:   [id number] ; Last Updated:   01/25/2020 9:45 EST ; Life Cycle Date:   Unknown 06/2019 ; Life Cycle Status:   Resolved ; Vocabulary:   SNOMED CT ; Resolved Date:   Unknown 06/2019 ; Resolved Age:   22 years            (As Of: 01/25/2020 09:45:53 EST)   Problems(Active)    Anxiety (IMO  :33295 )  Name of Problem:   Anxiety ; Recorder:   Marcellina Millin; Confirmation:   Confirmed ; Classification:   Medical ; Code:   [phone] ; Contributor System:   Dietitian ; Last Updated:   12/20/2015 19:07 EST ; Life Cycle Date:   12/20/2015 ; Life Cycle Status:   Active ; Vocabulary:   IMO        Asthma (SNOMED CT  :660630160 )  Name of Problem:   Asthma ; Recorder:   Marcellina Millin; Confirmation:   Confirmed ; Classification:   Medical ; Code:   [phone] ; Contributor System:   Dietitian ; Last Updated:   12/20/2015 19:07 EST ; Life Cycle Date:   12/20/2015 ; Life Cycle Status:   Active ; Vocabulary:   SNOMED CT        Preeclampsia (SNOMED CT  :3220254270 )  Name of Problem:   Preeclampsia ; Recorder:   Julien Girt, RN, Programme researcher, broadcasting/film/video A; Confirmation:   Confirmed ; Classification:   Patient Stated ; Code:   [phone] ; Contributor System:   Dietitian ; Last Updated:   10/23/2019 15:25 EST ; Life Cycle Date:   10/23/2019 ; Life Cycle Status:   Active ; Vocabulary:   SNOMED CT        Pregnant (SNOMED CT  :176160737 )  Name of Problem:   Pregnant ; Onset Date:   09/13/2019 ; Recorder:   Colvin Caroli, RN, Cecil Cranker; Confirmation:   Confirmed ; Classification:   Medical ; Code:   [id number] ; Last Updated:   01/25/2020 9:28 EST ; Life Cycle Status:   Active ; Responsible Provider:   Colvin Caroli, RN, Cecil Cranker; Vocabulary:   SNOMED CT          Harm Screen   Injuries/Abuse/Neglect in Household :   Denies   Feels  Unsafe at Home :   No   Last 3 mo, thoughts killing self/others :   Patient denies    Saverio Danker - 01/25/2020 9:30 EST   Social History   Social History   (As Of: 01/25/2020 09:45:53 EST)   Tobacco:        Never smoker   (Last Updated: 12/20/2015 19:08:46 EST by Marcellina Millin)          Electronic Cigarette/Vaping:        Never Electronic Cigarette Use.   (Last Updated: 01/25/2020 09:44:09 EST by Colvin Caroli, RN, Cecil Cranker)          Alcohol:        Denies   (Last Updated: 12/20/2015 19:08:51 EST by Marcellina Millin)            Immunizations   Influenza Vaccine Status :   Patient refused   Influenza Vaccine Status :   Unknown   Colvin Caroli RNCecil Cranker - 01/25/2020 9:30 EST   Advance Directive   Advance Directive :   No   Colvin Caroli, RN, Cecil Cranker - 01/25/2020 9:30 EST   Bloodless Medicine   Is Blood Transfusion Acceptable to Patient :   Yes   Colvin Caroli, RN, Cecil Cranker - 01/25/2020 9:30 EST   Valuables and Belongings   Does Patient Have Valuables and Belongings :   Yes   Colvin Caroli, RN, Cecil Cranker - 01/25/2020 9:30 EST   Valuables and Belongings   At Bedside :   Clothes, Purse/Wallet   Colvin Caroli, RN, Cecil Cranker - 01/25/2020 9:30 EST   Patient Search Completed :   Cindra Eves, RN, Cecil Cranker - 01/25/2020 9:30 EST   Assessment   Level of Consciousness :   Alert   Orientation :   Oriented x 4   Affect/Behavior :   Appropriate   Skin Color :   Normal for ethnicity   Skin Description :   Dry   Skin Temperature :   Warm   Colvin Caroli RNCecil Cranker - 01/25/2020 9:30 EST

## 2020-01-25 NOTE — ED Notes (Signed)
ED Patient Education Note     Patient Education Materials Follows:  Obstetrics and Gynecology     Nausea, Adult    Nausea is the feeling that you have an upset stomach or have to vomit. Nausea by itself is not likely a serious concern, but it may be an early sign of more serious medical problems. As nausea gets worse, it can lead to vomiting. If vomiting develops, there is the risk of dehydration.       CAUSES     Viral infections.     Food poisoning.     Medicines.     Pregnancy.     Motion sickness.     Migraine headaches.     Emotional distress.     Severe pain from any source.     Alcohol intoxication.     HOME CARE INSTRUCTIONS     Get plenty of rest.      Ask your caregiver about specific rehydration instructions.     Eat small amounts of food and sip liquids more often.     Take all medicines as told by your caregiver.     SEEK MEDICAL CARE IF:     You have not improved after 2 days, or you get worse.     You have a headache.    SEEK IMMEDIATE MEDICAL CARE IF:     You have a fever.      You faint.     You keep vomiting or have blood in your vomit.     You are extremely weak or dehydrated.     You have dark or bloody stools.     You have severe chest or abdominal pain.     MAKE SURE YOU:     Understand these instructions.     Will watch your condition.     Will get help right away if you are not doing well or get worse.    This information is not intended to replace advice given to you by your health care provider. Make sure you discuss any questions you have with your health care provider.    Document Released: 12/29/2004 Document Revised: 12/12/2014 Document Reviewed: 07/28/2015  Elsevier Interactive Patient Education ?2016 [place]    WHAT IS PRENATAL CARE?    Prenatal care is the process of caring for a pregnant woman before she gives birth. Prenatal care makes sure that she and her baby remain as healthy as possible throughout pregnancy. Prenatal care may be provided by a  midwife, family practice health care provider, or a childbirth and pregnancy specialist (obstetrician). Prenatal care may include physical examinations, testing, treatments, and education on nutrition, lifestyle, and social support services.    WHY IS PRENATAL CARE SO IMPORTANT?    Early and consistent prenatal care increases the chance that you and your baby will remain healthy throughout your pregnancy. This type of care also decreases a baby's risk of being born too early (prematurely), or being born smaller than expected (small for gestational age). Any underlying medical conditions you may have that could pose a risk during your pregnancy are discussed during prenatal care visits. You will also be monitored regularly for any new conditions that may arise during your pregnancy so they can be treated quickly and effectively.    WHAT HAPPENS DURING PRENATAL CARE VISITS?    Prenatal care visits may include the following:    Discussion    Tell  your health care provider about any new signs or symptoms you have experienced since your last visit. These might include:     Nausea or vomiting.     Increased or decreased level of energy.     Difficulty sleeping.     Back or leg pain.     Weight changes.     Frequent urination.     Shortness of breath with physical activity.     Changes in your skin, such as the development of a rash or itchiness.     Vaginal discharge or bleeding.     Feelings of excitement or nervousness.     Changes in your baby's movements.    You may want to write down any questions or topics you want to discuss with your health care provider and bring them with you to your appointment.    Examination    During your first prenatal care visit, you will likely have a complete physical exam. Your health care provider will often examine your vagina, cervix, and the position of your uterus, as well as check your heart, lungs, and other body systems. As your pregnancy progresses, your health care provider  will measure the size of your uterus and your baby's position inside your uterus. He or she may also examine you for early signs of labor. Your prenatal visits may also include checking your blood pressure and, after about 10?12 weeks of pregnancy, listening to your baby's heartbeat.    Testing    Regular testing often includes:     Urinalysis. This checks your urine for glucose, protein, or signs of infection.     Blood count. This checks the levels of white and red blood cells in your body.     Tests for sexually transmitted infections (STIs). Testing for STIs at the beginning of pregnancy is routinely done and is required in many states.      Antibody testing. You will be checked to see if you are immune to certain illnesses, such as rubella, that can affect a developing fetus.     Glucose screen. Around 24?28 weeks of pregnancy, your blood glucose level will be checked for signs of gestational diabetes. Follow-up tests may be recommended.     Group B strep. This is a bacteria that is commonly found inside a woman's vagina. This test will inform your health care provider if you need an antibiotic to reduce the amount of this bacteria in your body prior to labor and childbirth.     Ultrasound. Many pregnant women undergo an ultrasound screening around 18?20 weeks of pregnancy to evaluate the health of the fetus and check for any developmental abnormalities.     HIV (human immunodeficiency virus) testing. Early in your pregnancy, you will be screened for HIV. If you are at high risk for HIV, this test may be repeated during your third trimester of pregnancy.    You may be offered other testing based on your age, personal or family medical history, or other factors.     HOW OFTEN SHOULD I PLAN TO SEE MY HEALTH CARE PROVIDER FOR PRENATAL CARE?    Your prenatal care check-up schedule depends on any medical conditions you have before, or develop during, your pregnancy. If you do not have any underlying medical  conditions, you will likely be seen for checkups:     Monthly, during the first 6 months of pregnancy.     Twice a month during months 7 and 8 of pregnancy.  Weekly starting in the 9th month of pregnancy and until delivery.    If you develop signs of early labor or other concerning signs or symptoms, you may need to see your health care provider more often. Ask your health care provider what prenatal care schedule is best for you.    WHAT CAN I DO TO KEEP MYSELF AND MY BABY AS HEALTHY AS POSSIBLE DURING MY PREGNANCY?     Take a prenatal vitamin containing 400 micrograms (0.4 mg) of folic acid every day. Your health care provider may also ask you to take additional vitamins such as iodine, vitamin D, iron, copper, and zinc.     Take 1500?2000 mg of calcium daily starting at your 20th week of pregnancy until you deliver your baby.     Make sure you are up to date on your vaccinations. Unless directed otherwise by your health care provider:    ? You should receive a tetanus, diphtheria, and pertussis (Tdap) vaccination between the 27th and 36th week of your pregnancy, regardless of when your last Tdap immunization occurred. This helps protect your baby from whooping cough (pertussis) after he or she is born.    ? You should receive an annual inactivated influenza vaccine (IIV) to help protect you and your baby from influenza. This can be done at any point during your pregnancy.     Eat a well-rounded diet that includes:    ? Fresh fruits and vegetables.    ? Lean proteins.    ? Calcium-rich foods such as milk, yogurt, hard cheeses, and dark, leafy greens.    ? Whole grain breads.      Do not?eat seafood high in mercury, including:    ? Swordfish.    ? Tilefish.    ? Shark.    ? King mackerel.    ? More than 6 oz tuna per week.      Do not eat:    ? Raw or undercooked meats or eggs.    ? Unpasteurized foods, such as soft cheeses (brie, blue, or feta), juices, and milks.    ? Lunch meats.     ? Hot dogs that have not  been heated until they are steaming.     Drink enough water to keep your urine clear or pale yellow. For many women, this may be 10 or more 8 oz glasses of water each day. Keeping yourself hydrated helps deliver nutrients to your baby and may prevent the start of pre-term uterine contractions.     Do not use any tobacco products including cigarettes, chewing tobacco, or electronic cigarettes. If you need help quitting, ask your health care provider.     Do not drink beverages containing alcohol. No safe level of alcohol consumption during pregnancy has been determined.      Do not use any illegal drugs. These can harm your developing baby or cause a miscarriage.      Ask your health care provider or pharmacist before taking any prescription or over-the-counter medicines, herbs, or supplements.     Limit your caffeine intake to no more than 200 mg per day.     Exercise. Unless told otherwise by your health care provider, try to get 30 minutes of moderate exercise most days of the week. Do not  do high-impact activities, contact sports, or activities with a high risk of falling, such as horseback riding or downhill skiing.     Get plenty of rest.     Avoid anything  that raises your body temperature, such as hot tubs and saunas.     If you own a cat, do not empty its litter box. Bacteria contained in cat feces can cause an infection called toxoplasmosis. This can result in serious harm to the fetus.     Stay away from chemicals such as insecticides, lead, mercury, and cleaning or paint products that contain solvents.     Do not have any X-rays taken unless medically necessary.     Take a childbirth and breastfeeding preparation class. Ask your health care provider if you need a referral or recommendation.     This information is not intended to replace advice given to you by your health care provider. Make sure you discuss any questions you have with your health care provider.    Document Released: 11/24/2003 Document  Revised: 12/12/2014 Document Reviewed: 02/05/2014  Elsevier Interactive Patient Education ?2016 [employer]vier Inc[employer]

## 2020-01-25 NOTE — Discharge Summary (Signed)
 ED Clinical Summary              [facility]  Post-Acute Care Transfer Instructions  PERSON INFORMATION  Name: PHYLLISS, STREGE  MRN: [id number]   FIN#: [id number]  PHYSICIANS  Admitting Physician: IDOLINA AMBLE [facility]  Attending Physician: IDOLINA AMBLE ANN  PCP: PCP,  NONE  Discharge Diagnosis:  [redacted] weeks gestation of pregnancy; Nausea and vomiting in pregnancy  Comment:     PATIENT EDUCATION INFORMATION  Instructions:  Nausea, Adult; Prenatal Care  Medication Leaflets:    Follow-up:         MEDICATION LIST  Medication Reconciliation at Discharge:          New Medications  Other Medications  acetaminophen  (acetaminophen  500 mg oral tablet) 2 Tabs Oral (given by mouth) every 6 hours as needed mild pain (1-3).  Last Dose:____________________  Medications that have not changed  Other Medications  multivitamin, prenatal (Classic Prenatal) Oral Daily.  Last Dose:____________________  No Longer Take the Following Medications  ondansetron  (Zofran  4 mg oral tablet) 1 Tabs Oral (given by mouth) every 8 hours.  Stop Taking Reason: Physician Request         Patient's Final Home Medication List Upon Discharge:          acetaminophen  (acetaminophen  500 mg oral tablet) 2 Tabs Oral (given by mouth) every 6 hours as needed mild pain (1-3).  multivitamin, prenatal (Classic Prenatal) Oral Daily.         Comment:     ORDERS          Order Name Order Details   Discharge Patient 01/25/20 11:50:00 EST, Discharge Home/Self Care, return to Ob/ED for worsening symptoms, CTX, LOF, VB, decreased fetal movement or any acute change.

## 2022-04-20 ENCOUNTER — Emergency Department
Admission: EM | Admit: 2022-04-20 | Discharge: 2022-04-20 | Disposition: A | Discharge status: Home / Self Care | Payer: Medicaid HMO | Attending: Emergency Medicine | Admitting: Emergency Medicine

## 2022-04-20 DIAGNOSIS — N760 Acute vaginitis: Secondary | ICD-10-CM | POA: Insufficient documentation

## 2022-04-20 DIAGNOSIS — Z113 Encounter for screening for infections with a predominantly sexual mode of transmission: Secondary | ICD-10-CM | POA: Insufficient documentation

## 2022-04-20 LAB — URINALYSIS REFLEX TO MICROSCOPIC EXAM - REFLEX TO CULTURE
Bilirubin, UA: NEGATIVE
Blood, UA: NEGATIVE
Glucose, UA: NEGATIVE
Ketones UA: NEGATIVE
Nitrite, UA: NEGATIVE
Protein, UR: NEGATIVE
Specific Gravity UA: 1.015 (ref 1.001–1.035)
Urine pH: 6.5 (ref 5.0–8.0)
Urobilinogen, UA: 0.2 mg/dL (ref 0.2–2.0)

## 2022-04-20 LAB — URINE HCG QUALITATIVE: Urine HCG Qualitative: NEGATIVE

## 2022-04-20 MED ORDER — METRONIDAZOLE 250 MG PO TABS
500.0000 mg | ORAL_TABLET | Freq: Once | ORAL | Status: AC
Start: 2022-04-20 — End: 2022-04-20
  Administered 2022-04-20: 500 mg via ORAL
  Filled 2022-04-20: qty 2

## 2022-04-20 MED ORDER — DOXYCYCLINE MONOHYDRATE 50 MG PO CAPS
100.0000 mg | ORAL_CAPSULE | Freq: Once | ORAL | Status: AC
Start: 2022-04-20 — End: 2022-04-20
  Administered 2022-04-20: 100 mg via ORAL
  Filled 2022-04-20: qty 2

## 2022-04-20 MED ORDER — DOXYCYCLINE HYCLATE 100 MG PO TABS
100.0000 mg | ORAL_TABLET | Freq: Two times a day (BID) | ORAL | 0 refills | Status: AC
Start: 2022-04-20 — End: 2022-04-27

## 2022-04-20 MED ORDER — METRONIDAZOLE 500 MG PO TABS
500.0000 mg | ORAL_TABLET | Freq: Two times a day (BID) | ORAL | 0 refills | Status: AC
Start: 2022-04-20 — End: 2022-04-27

## 2022-04-20 MED ORDER — STERILE WATER FOR INJECTION IJ SOLN
INTRAMUSCULAR | Status: AC
Start: 2022-04-20 — End: 2022-04-20
  Filled 2022-04-20: qty 10

## 2022-04-20 MED ORDER — IBUPROFEN 600 MG PO TABS
600.0000 mg | ORAL_TABLET | Freq: Once | ORAL | Status: AC
Start: 2022-04-20 — End: 2022-04-20
  Administered 2022-04-20: 600 mg via ORAL
  Filled 2022-04-20: qty 1

## 2022-04-20 MED ORDER — CEFTRIAXONE SODIUM 1 G IJ SOLR
1.0000 g | Freq: Once | INTRAMUSCULAR | Status: AC
Start: 2022-04-20 — End: 2022-04-20
  Administered 2022-04-20: 1 g via INTRAMUSCULAR
  Filled 2022-04-20 (×2): qty 1000

## 2022-04-20 NOTE — ED Provider Notes (Signed)
EMERGENCY DEPARTMENT NOTE     Patient initially seen and examined at   ED PHYSICIAN ASSIGNED       None           ED MIDLEVEL (APP) ASSIGNED       Date/Time Event User Comments    04/20/22 1136 PA/NP Provider Normal, Livingston Diones, NP assigned as Nurse Practitioner            HISTORY OF PRESENT ILLNESS   Translator Used : No    Chief Complaint: Abdominal Pain       26 y.o. female with past medical history as below pt here from out of town with Suprapubic cramping, and a foul smelling white vaginal discharge x 1 week. Pts partner called her and told her he was tested for "something and given a pill". She is unsure what he was tested for.     Independent Historian (other than patient): No  Additional History Provided by Independent Historian:  MEDICAL HISTORY     Past Medical History:  History reviewed. No pertinent past medical history.    Past Surgical History:  Past Surgical History:   Procedure Laterality Date    CESAREAN SECTION         Social History:  Social History     Socioeconomic History    Marital status: Single   Tobacco Use    Smoking status: Never    Smokeless tobacco: Never   Vaping Use    Vaping status: Never Used   Substance and Sexual Activity    Alcohol use: Never    Drug use: Never       Family History:  History reviewed. No pertinent family history.    Outpatient Medication:  Discharge Medication List as of 04/20/2022 12:47 PM            REVIEW OF SYSTEMS   Review of Systems See History of Present Illness  PHYSICAL EXAM     ED Triage Vitals [04/20/22 1127]   Enc Vitals Group      BP 136/80      Heart Rate 89      Resp Rate 20      Temp 99 F (37.2 C)      Temp Source Oral      SpO2 100 %      Weight 84.4 kg      Height 1.575 m      Head Circumference       Peak Flow       Pain Score 1      Pain Loc       Pain Edu?       Excl. in [place]?      Physical Exam   Nursing note and vitals reviewed.  Constitutional:  Well developed, well nourished.  Awake & alert.    Head:  Atraumatic.   Normocephalic.    ENT:  Mucous membranes are moist and intact.  Patent airway.  Neck:  Supple.  No JVD.   Cardiovascular:  Regular rate.  Regular rhythm.   Pulmonary/Chest:  No evidence of respiratory distress.    Abdominal:  Soft and non-distended.  There is no tenderness.  No rebound, guarding, or rigidity.  No masses palpable  Back:  No CVA tenderness.   Extremities:  No edema.   No cyanosis.  No clubbing.   Skin:  Skin is warm and dry.  No diaphoresis.    Neurological:  Alert, awake, and appropriate.  Normal  speech.  Moves all extremities.  Normal gate.  Psychiatric:  Good eye contact.  Normal interaction, affect, and behavior    MEDICAL DECISION MAKING     PRIMARY PROBLEM LIST      Acute illness/injury DIAGNOSIS:bv  Chronic Illness Impacting Care of the above problem: N/A N/A  Ddx: bv, yeast, sti, uti, pregnancy  DISCUSSION        Presentation concerning for possible sexually transmitted infection.     Sent specimens for gonorrhea and chlamydia, Culture and Trichomoniasis, and Culture.    Urinalysis does not appear to be infected.  Hcg was negative.      Informed Pt they will be contacted w/ results when they become available if they are positive. Educated Pt on STI surveillance, and informed Pt that chlamydia and gonorrhea are reportable conditions to the [place] Department of Health and contact tracing may follow should results be positive.  Discussed with patient that it takes 1 days for results of cultures to be released and explained that we treat patients empirically at this time.     Advised Pt to communicate w/ sexual partner regarding possible exposure and risk of STI.  Advised Pt on avoiding sexual intercourse until resolution of symptoms, use of barrier contraception.        Administered oxycycline 100 mg bid x7d and advised avoidance of sun exposure and refraining from milk products for?-2hr before and after administration.  Administered ceftriaxone '1000mg'$  IM w/out complication.   Administered Flagyl  '500mg'$  bid x 7d     Instructed Pt to f/up w/ PCP or ER should worrisome symptoms present. Pt verbally expressed understanding and all questions were addressed to Pt's satisfaction.      If patient is being hospitalized is severe sepsis or septic shock suspected?: N/A          External Records Reviewed?: N/A    Additional Notes                     Vital Signs: Reviewed the patient's vital signs.   Nursing Notes: Reviewed and utilized available nursing notes.  Medical Records Reviewed: Reviewed available past medical records.  Counseling: The emergency provider has spoken with the patient and discussed today's findings, in addition to providing specific details for the plan of care.  Questions are answered and there is agreement with the plan.      MIPS DOCUMENTATION              CARDIAC STUDIES    The following cardiac studies were independently interpreted by me the Emergency Medicine Provider.  For full cardiac study results please see chart.    Monitor Strip interpreted by me (ED provider)  Rate: 60-100  Rhythm: Normal Sinus Rhythm  ST segments: No acute changes                              EMERGENCY IMAGING STUDIES    The following imagine studies were independently interpreted by me (emergency medicine provider):      RADIOLOGY IMAGING STUDIES      No orders to display       EMERGENCY DEPT. MEDICATIONS      ED Medication Orders (From admission, onward)      Start Ordered     Status Ordering Provider    04/20/22 1210 04/20/22 1210  sterile water (preservative free) injection        Note to Pharmacy: Elder Cyphers: cabinet  override    Last MAR action: Given     04/20/22 1158 04/20/22 1157  ibuprofen (ADVIL) tablet 600 mg  Once        Route: Oral  Ordered Dose: 600 mg       Last MAR action: Given Oliver Hum    04/20/22 1154 04/20/22 1153  metroNIDAZOLE (FLAGYL) tablet 500 mg  Once        Route: Oral  Ordered Dose: 500 mg       Last MAR action: Given Oliver Hum    04/20/22 1153 04/20/22 1152  doxycycline  (MONODOX) capsule 100 mg  Once        Route: Oral  Ordered Dose: 100 mg       Last MAR action: Given Oliver Hum    04/20/22 1152 04/20/22 1151  cefTRIAXone (ROCEPHIN) injection 1 g  Once        Route: Intramuscular  Ordered Dose: 1 g       Last MAR action: Given Satomi Buda, [place] S            LABORATORY RESULTS    Ordered and independently interpreted AVAILABLE laboratory tests.   Results       Procedure Component Value Units Date/Time    Urinalysis Reflex to Microscopic Exam- Reflex to Culture [id number]  (Abnormal) Collected: 04/20/22 1200     Updated: 04/20/22 1236     Urine Type Urine, Clean Ca     Color, UA YELLOW     Clarity, UA CLEAR     Specific Gravity UA 1.015     Urine pH 6.5     Leukocyte Esterase, UA SMALL     Nitrite, UA NEGATIVE     Protein, UR NEGATIVE     Glucose, UA NEGATIVE     Ketones UA NEGATIVE     Urobilinogen, UA 0.2 mg/dL      Bilirubin, UA NEGATIVE     Blood, UA NEGATIVE     WBC, UA 6 - 10 /hpf     Urine HCG Qualitative [id number] Collected: 04/20/22 1200    Specimen: Urine Updated: 04/20/22 1236     Urine HCG Qualitative Negative    Genital - Chlamydia/Neisseria/Trich PCR [id number] Collected: 04/20/22 1200     Updated: 04/20/22 1228    Culture, Aerobic, Genital [id number] Collected: 04/20/22 1200    Specimen: Vaginal Swab Updated: 04/20/22 1228              CRITICAL CARE/PROCEDURES    Procedures    DIAGNOSIS      Diagnosis:  Final diagnoses:   Screening for STD (sexually transmitted disease)   BV (bacterial vaginosis)       Disposition:  ED Disposition       ED Disposition   Discharge    Condition   --    Date/Time   Wed Apr 20, 2022 12:47 PM    Comment   Luccia Emmoni Peifer discharge to home/self care.    Condition at disposition: Stable                 Prescriptions:  Discharge Medication List as of 04/20/2022 12:47 PM        START taking these medications    Details   doxycycline (VIBRA-TABS) 100 MG tablet Take 1 tablet (100 mg) by mouth 2 (two) times daily for 7 days, Starting  Wed 04/20/2022, Until Wed 04/27/2022, E-Rx      metroNIDAZOLE (FLAGYL) 500 MG tablet Take 1 tablet (500 mg)  by mouth 2 (two) times daily for 7 days, Starting Wed 04/20/2022, Until Wed 04/27/2022, E-Rx                 This note was generated by the Epic EMR system/ Dragon speech recognition and may contain inherent errors or omissions not intended by the user. Grammatical errors, random word insertions, deletions and pronoun errors  are occasional consequences of this technology due to software limitations. Not all errors are caught or corrected. If there are questions or concerns about the content of this note or information contained within the body of this dictation they should be addressed directly with the author for clarification.           Oliver Hum, NP  04/20/22 2053

## 2022-04-21 ENCOUNTER — Telehealth: Payer: Self-pay | Admitting: Emergency Medicine

## 2022-04-21 LAB — GENITAL - CHLAMYDIA/NEISSERIA/TRICH PCR
Chlamydia DNA by PCR: POSITIVE — AB
Neisseria gonorrhoeae by PCR: NEGATIVE
Trichomonas vaginalis, DNA: NEGATIVE

## 2022-04-21 NOTE — Telephone Encounter (Signed)
7:24 AM - Called about +chlamydia results, instructed to finish antibiotics, inform sexual partners and refrain from sex for 1 week after antibiotics finished.

## 2022-11-23 ENCOUNTER — Inpatient Hospital Stay
Admit: 2022-11-23 | Discharge: 2022-11-23 | Disposition: A | Discharge status: Home / Self Care | Payer: MEDICAID | Attending: Emergency Medicine

## 2022-11-23 DIAGNOSIS — B349 Viral infection, unspecified: Secondary | ICD-10-CM

## 2022-11-23 NOTE — ED Provider Notes (Signed)
[facility] EMERGENCY DEPT  EMERGENCY DEPARTMENT ENCOUNTER      Pt Name: Wendy Gentry  MRN: [id number]  Birthdate [birth year]  Date of evaluation: 11/23/2022  Provider evaluation time: 11/23/22 1804  Provider: Tami Ribas, MD    CHIEF COMPLAINT       Chief Complaint   Patient presents with    Pharyngitis     Pt C/O throat pain and bilateral ear pain X 4 days         HISTORY OF PRESENT ILLNESS    HPI  Patient with patient presents with sore throat x 4 5 days.  Has had a cough and stuffy nose.  Seen at [facility] for the same negative workup presents here because she was sent home from work because she did not look well.  No vomiting or diarrhea no known fever.  Nursing Notes were reviewed.    REVIEW OF SYSTEMS       Review of Systems   Constitutional:  Negative for fever.       Except as noted above the remainder of the review of systems was reviewed and negative.     PAST MEDICAL HISTORY   No past medical history on file.  SURGICAL HISTORY     No past surgical history on file.  CURRENT MEDICATIONS       Previous Medications    No medications on file     ALLERGIES     Patient has no known allergies.  FAMILY HISTORY     No family history on file.  SOCIAL HISTORY       Social History     Socioeconomic History    Marital status: Single     SCREENINGS                     CIWA Assessment  BP: 135/73  Pulse: (!) 101           PHYSICAL EXAM       ED Triage Vitals [11/23/22 1757]   BP Temp Temp Source Pulse Respirations SpO2 Height Weight - Scale   135/73 98.7 F (37.1 C) Oral (!) 101 18 100 % 1.575 m (5\' 2" ) 87.5 kg (193 lb)       Physical Exam  Vitals and nursing note reviewed.   HENT:      Head: Normocephalic and atraumatic.      Right Ear: External ear normal.      Left Ear: External ear normal.      Mouth/Throat:      Mouth: Mucous membranes are moist.      Pharynx: No pharyngeal swelling, oropharyngeal exudate, posterior oropharyngeal erythema or uvula swelling.      Tonsils: No tonsillar exudate.   Eyes:       Conjunctiva/sclera: Conjunctivae normal.   Cardiovascular:      Rate and Rhythm: Normal rate and regular rhythm.   Pulmonary:      Effort: Pulmonary effort is normal. No respiratory distress.      Breath sounds: Normal breath sounds. No wheezing.   Abdominal:      General: Abdomen is flat. There is no distension.      Palpations: Abdomen is soft.      Tenderness: There is no abdominal tenderness.   Musculoskeletal:         General: No tenderness or deformity. Normal range of motion.      Cervical back: No rigidity.      Right lower leg: No edema.  Left lower leg: No edema.   Lymphadenopathy:      Cervical: No cervical adenopathy.   Skin:     General: Skin is warm and dry.      Coloration: Skin is not pale.   Neurological:      General: No focal deficit present.      Mental Status: She is alert and oriented to person, place, and time.      Gait: Gait normal.   Psychiatric:         Mood and Affect: Mood normal.         Thought Content: Thought content normal.         DIAGNOSTIC RESULTS       PROCEDURES:  Unless otherwise noted below, none     Procedures    EKG: All EKG's are interpreted by the Emergency Department Physician who either signs or Co-signs this chart in the absence of a cardiologist.    LABS:  Labs Reviewed - No data to display    All other labs were within normal range or not returned as of this dictation.    RADIOLOGY:   Non-plain film images such as CT, Ultrasound and MRI are read by the radiologist. Plain radiographic images are visualized and preliminarily interpreted by the emergency physician with the below findings:    Interpretation per the Radiologist below, if available at the time of this note:    No orders to display     [place] and MDM:   Vitals:    Vitals:    11/23/22 1757   BP: 135/73   Pulse: (!) 101   Resp: 18   Temp: 98.7 F (37.1 C)   TempSrc: Oral   SpO2: 100%   Weight: 87.5 kg (193 lb)   Height: 1.575 m (5\' 2" )       ED Course:    ED Course as of  11/23/22 1812   Wed Nov 23, 2022   1811 Recent workup at another emergency room for the same had negative COVID flu and strep she says.  She will continue Tylenol and Advil she says it does make her pain less.  On exam she has no evidence of pharyngitis or throat is not erythematous there is no adenopathy cervically noted.  Clear viral illness. [CM]      ED Course User Index  [CM] Rozell Searing, MD       MDM      FINAL IMPRESSION      1. Viral illness          DISPOSITION/PLAN   DISPOSITION Decision To Discharge 11/23/2022 06:12:14 PM      PATIENT REFERRED TO:  No, Pcp    In 1 week        DISCHARGE MEDICATIONS:  New Prescriptions    No medications on file       (Please note that portions of this note were completed with a voice recognition program.  Efforts were made to edit the dictations but occasionally words are mis-transcribed.)    Rozell Searing, MD (electronically signed)  Attending Emergency Physician           Rozell Searing, MD  11/23/22 [id number]

## 2023-01-31 ENCOUNTER — Inpatient Hospital Stay
Admit: 2023-01-31 | Discharge: 2023-01-31 | Disposition: A | Discharge status: Home / Self Care | Payer: MEDICAID | Attending: Emergency Medicine

## 2023-01-31 DIAGNOSIS — J0300 Acute streptococcal tonsillitis, unspecified: Secondary | ICD-10-CM

## 2023-01-31 MED ORDER — IBUPROFEN 800 MG PO TABS
800 | Freq: Once | ORAL | Status: AC
Start: 2023-01-31 — End: 2023-01-31
  Administered 2023-01-31: 17:00:00 800 mg via ORAL

## 2023-01-31 MED ORDER — DEXAMETHASONE SOD PHOSPHATE PF 10 MG/ML IJ SOLN
10 | Freq: Once | INTRAMUSCULAR | Status: AC
Start: 2023-01-31 — End: 2023-01-31
  Administered 2023-01-31: 17:00:00 10 mg via INTRAMUSCULAR

## 2023-01-31 MED FILL — IBUPROFEN 800 MG PO TABS: 800 mg | ORAL | Qty: 1

## 2023-01-31 MED FILL — DEXAMETHASONE SOD PHOSPHATE PF 10 MG/ML IJ SOLN: 10 mg/mL | INTRAMUSCULAR | Qty: 1

## 2023-01-31 NOTE — ED Provider Notes (Signed)
[facility] EMERGENCY DEPT  EMERGENCY DEPARTMENT ENCOUNTER      Pt Name: Wendy Gentry  MRN: [id number]  [place] [birth year]  Date of evaluation: 01/31/2023  Provider evaluation time: 01/31/23 1156  Provider: Wyvonne Lenz, MD    CHIEF COMPLAINT       Chief Complaint   Patient presents with    Pharyngitis     Pt tested positive for strep throat and thinks she might be allergic to the pcn they gave her.  States her throat hasn't gotten any better and she feels like her throat is closing up.         HISTORY OF PRESENT ILLNESS    27 year old female who was diagnosed with strep throat yesterday and presents for evaluation of sore throat.  She is concerned she may have a reaction to the Augmentin as her throat is more sore.  She has tolerated amoxicillin in the past without difficulty.  She has not had any rash, itchiness or vomiting.  No drooling or trismus.  She is currently drinking [employer].  She tried Motrin at 7 AM with some relief.  She is tolerating secretions.    The history is provided by the patient.       Nursing Notes were reviewed.    REVIEW OF SYSTEMS       Review of Systems   All other systems reviewed and are negative.      Except as noted above the remainder of the review of systems was reviewed and negative.       PAST MEDICAL HISTORY   No past medical history on file.      SURGICAL HISTORY     No past surgical history on file.      CURRENT MEDICATIONS       Previous Medications    No medications on file       ALLERGIES     Patient has no active allergies.    FAMILY HISTORY     No family history on file.       SOCIAL HISTORY       Social History     Socioeconomic History    Marital status: Single       SCREENINGS         Glasgow Coma Scale  Eye Opening: Spontaneous  Best Verbal Response: Oriented  Best Motor Response: Obeys commands  Glasgow Coma Scale Score: 15                     CIWA Assessment  BP: 130/72  Pulse: 87                 PHYSICAL EXAM       ED Triage Vitals [01/31/23 1152]   BP Temp  Temp Source Pulse Respirations SpO2 Height Weight - Scale   130/72 98.6 F (37 C) Oral 87 14 99 % 1.575 m (5' 2"$ ) 86.2 kg (190 lb)       Physical Exam  Vitals and nursing note reviewed.   Constitutional:       General: She is not in acute distress.     Appearance: Normal appearance. She is not ill-appearing.   HENT:      Head: Normocephalic and atraumatic.      Nose: No congestion.      Mouth/Throat:      Mouth: Mucous membranes are moist. No oral lesions.      Pharynx: Uvula midline. Posterior oropharyngeal erythema present. No uvula swelling.  Tonsils: Tonsillar exudate present. No tonsillar abscesses. 2+ on the right. 2+ on the left.      Comments: No evidence of angioedema/tongue swelling.  Airway patent.  No drooling or trismus.  Normal phonation  Eyes:      Extraocular Movements: Extraocular movements intact.      Conjunctiva/sclera: Conjunctivae normal.      Pupils: Pupils are equal, round, and reactive to light.   Cardiovascular:      Rate and Rhythm: Normal rate and regular rhythm.   Pulmonary:      Effort: Pulmonary effort is normal. No respiratory distress.   Abdominal:      General: Abdomen is flat.   Musculoskeletal:         General: No deformity. Normal range of motion.      Cervical back: Normal range of motion and neck supple.   Lymphadenopathy:      Cervical: Cervical adenopathy present.   Skin:     General: Skin is warm and dry.      Findings: No rash.   Neurological:      General: No focal deficit present.      Mental Status: She is alert.   Psychiatric:         Mood and Affect: Mood normal.         DIAGNOSTIC RESULTS     EKG: All EKG's are interpreted by the Emergency Department Physician who either signs or Co-signs this chart in the absence of a cardiologist.      RADIOLOGY:   Non-plain film images such as CT, Ultrasound and MRI are read by the radiologist. Plain radiographic images are visualized and preliminarily interpreted by the emergency physician with the below  findings:      Interpretation per the Radiologist below, if available at the time of this note:    No orders to display         LABS:  Labs Reviewed - No data to display    All other labs were within normal range or not returned as of this dictation.    EMERGENCY DEPARTMENT COURSE and DIFFERENTIAL DIAGNOSIS/MDM:   Vitals:    Vitals:    01/31/23 1152   BP: 130/72   Pulse: 87   Resp: 14   Temp: 98.6 F (37 C)   TempSrc: Oral   SpO2: 99%   Weight: 86.2 kg (190 lb)   Height: 1.575 m (5' 2"$ )         Medical Decision Making  I suspect this patient's symptoms are secondary to tonsillitis rather than allergic reaction.  She has no clinical features otherwise to suggest allergic reaction and has tolerated penicillins in the past.  I have advised she continue this to completion.  I will give the patient IM Decadron in addition to oral Motrin.  She does not have evidence of drainable PTA.  Her airway is patent.  Advised following up with PCP or returning with worsening symptoms    Amount and/or Complexity of Data Reviewed  External Data Reviewed: notes.    Risk  Prescription drug management.          REASSESSMENT          CONSULTS:  None    PROCEDURES:  Unless otherwise noted below, none     Procedures      FINAL IMPRESSION      1. Acute streptococcal tonsillitis, not specified as recurrent or not          DISPOSITION/PLAN   DISPOSITION  Decision To Discharge 01/31/2023 12:12:53 PM      PATIENT REFERRED TO:  Your Primary Care Doctor    Schedule an appointment as soon as possible for a visit in 2 days  As needed      DISCHARGE MEDICATIONS:  New Prescriptions    No medications on file     Controlled Substances Monitoring:          No data to display                (Please note that portions of this note were completed with a voice recognition program.  Efforts were made to edit the dictations but occasionally words are mis-transcribed.)    Wyvonne Lenz, MD (electronically signed)  Attending Emergency Physician              Wyvonne Lenz, MD  01/31/23 1215

## 2023-01-31 NOTE — Discharge Instructions (Signed)
Continue salt water gargles in addition to alternating between Tylenol and Motrin.  Return to the ER if worsening symptoms.  Continue your Augmentin as your symptoms here are not consistent with an allergic reaction

## 2023-02-25 ENCOUNTER — Inpatient Hospital Stay: Admit: 2023-02-25 | Discharge: 2023-02-25 | Disposition: A | Discharge status: Home / Self Care | Payer: MEDICAID

## 2023-02-25 DIAGNOSIS — E860 Dehydration: Secondary | ICD-10-CM

## 2023-02-25 LAB — POC PREGNANCY UR-QUAL
HCG, Urine, POC: NEGATIVE
Lot Number: 1810872
Preg Test, Ur: NEGATIVE

## 2023-02-25 LAB — CBC WITH AUTO DIFFERENTIAL
Basophils %: 0.4 % (ref 0.0–2.0)
Basophils Absolute: 0 10*3/uL (ref 0.0–0.2)
Eosinophils %: 0.8 % (ref 0.0–7.0)
Eosinophils Absolute: 0.1 10*3/uL (ref 0.0–0.5)
Hematocrit: 34 % (ref 34.0–47.0)
Hemoglobin: 11 g/dL — ABNORMAL LOW (ref 11.5–15.7)
Immature Grans (Abs): 0 10*3/uL (ref 0.00–0.06)
Immature Granulocytes %: 0 % (ref 0.0–0.6)
Lymphocytes Absolute: 1.9 10*3/uL (ref 1.0–3.2)
Lymphocytes: 25.3 % (ref 15.0–45.0)
MCH: 24.9 pg — ABNORMAL LOW (ref 27.0–34.5)
MCHC: 32.4 g/dL (ref 32.0–36.0)
MCV: 76.9 fL — ABNORMAL LOW (ref 81.0–99.0)
MPV: 11.5 fL (ref 7.2–13.2)
Monocytes %: 7.7 % (ref 4.0–12.0)
Monocytes Absolute: 0.6 10*3/uL (ref 0.3–1.0)
Neutrophils %: 65.8 % (ref 42.0–74.0)
Neutrophils Absolute: 4.9 10*3/uL (ref 1.6–7.3)
Platelets: 265 10*3/uL (ref 140–440)
RBC: 4.42 x10e6/mcL (ref 3.60–5.20)
RDW: 15.2 % (ref 11.0–16.0)
WBC: 7.4 10*3/uL (ref 3.8–10.6)

## 2023-02-25 LAB — COMPREHENSIVE METABOLIC PANEL
ALT: 38 U/L — ABNORMAL HIGH (ref 0–35)
AST: 30 U/L (ref 0–35)
Albumin/Globulin Ratio: 0.9 — ABNORMAL LOW (ref 1.00–2.70)
Albumin: 3.7 g/dL (ref 3.5–5.2)
Alk Phosphatase: 69 U/L (ref 35–117)
Anion Gap: 9 mmol/L (ref 2–17)
BUN: 9 mg/dL (ref 6–20)
CO2: 22 mmol/L (ref 22–29)
Calcium: 8.7 mg/dL (ref 8.5–10.7)
Chloride: 106 mmol/L (ref 98–107)
Creatinine: 0.7 mg/dL (ref 0.5–1.0)
Est, Glom Filt Rate: 124 mL/min/1.73m (ref >=60)
Globulin: 4 g/dL (ref 1.9–4.4)
Glucose: 123 mg/dL — ABNORMAL HIGH (ref 70–99)
Osmolaliy Calculated: 272 mOsm/kg (ref 270–287)
Potassium: 3.8 mmol/L (ref 3.5–5.3)
Sodium: 136 mmol/L (ref 135–145)
Total Bilirubin: 0.32 mg/dL (ref 0.00–1.20)
Total Protein: 7.7 g/dL (ref 5.7–8.3)

## 2023-02-25 LAB — POC URINALYSIS, CHEMISTRY
Bilirubin, Urine, POC: NEGATIVE
Blood, UA POC: NEGATIVE
Glucose, UA POC: NEGATIVE mg/dL
Ketones, Urine, POC: NEGATIVE mg/dL
Nitrate, UA POC: NEGATIVE
Protein, Urine, POC: 30 — AB
Specific Gravity, Urine, POC: 1.025 (ref 1.003–1.035)
Urine Urobilinogen: >=8 EU/dL — AB (ref >0.2)
pH, Urine, POC: 6.5 (ref 4.5–8.0)

## 2023-02-25 LAB — LIPASE: Lipase: 26 U/L (ref 13–60)

## 2023-02-25 MED ORDER — KETOROLAC TROMETHAMINE 15 MG/ML IJ SOLN
15 | INTRAMUSCULAR | Status: AC
Start: 2023-02-25 — End: 2023-02-25
  Administered 2023-02-25: 22:00:00 15 via INTRAVENOUS

## 2023-02-25 MED ORDER — KETOROLAC TROMETHAMINE 15 MG/ML IJ SOLN
15 | Freq: Once | INTRAMUSCULAR | Status: AC
Start: 2023-02-25 — End: 2023-02-25

## 2023-02-25 MED ORDER — SODIUM CHLORIDE 0.9 % IV BOLUS
0.9 | Freq: Once | INTRAVENOUS | Status: AC
Start: 2023-02-25 — End: 2023-02-25
  Administered 2023-02-25: 22:00:00 1000 mL via INTRAVENOUS

## 2023-02-25 MED ORDER — ONDANSETRON 4 MG PO TBDP
4 | ORAL_TABLET | Freq: Four times a day (QID) | ORAL | 0 refills | Status: AC | PRN
Start: 2023-02-25 — End: 2023-03-07

## 2023-02-25 MED FILL — KETOROLAC TROMETHAMINE 15 MG/ML IJ SOLN: 15 MG/ML | INTRAMUSCULAR | Qty: 1

## 2023-02-25 NOTE — Discharge Instructions (Addendum)
Today your workup here in the emergency department is reassuring  Be sure to eat small snacks and drink fluids throughout the day  Take Zofran as needed for nausea/vomiting  Follow-up with psychiatry to discuss/manage anxiety  Follow-up with your primary care provider at first available appointment  Return to the emergency department immediately for any new or worsening symptoms

## 2023-02-25 NOTE — ED Provider Notes (Signed)
[facility] DEPT  EMERGENCY DEPARTMENT ENCOUNTER      Pt Name: Wendy Gentry  MRN: [id number]  [place] [birth year]  Provider evaluation time: 02/25/23 1801  Provider: Levie Heritage, PA-C    CHIEF COMPLAINT       Chief Complaint   Patient presents with    Dizziness     Pt states she has been feeling intermittently lightheaded since Tues. States she feels like she is dehydrated and not eating/drinking much. C/o migraine. Denies blurry vision,sensitivity to light or sound. Also having anxiety intermittently r/t anniversary of parents death.         HISTORY OF PRESENT ILLNESS    Wendy Gentry is a 27 y.o. female  who presents to the emergency department for evaluation of lightheadedness since Tuesday.  She states that she feels like she is dehydrated.  She states she feels like she has not been eating and drinking much.  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  She states she has a generalized headache.  She denies any blurred vision or double vision.  Denies any sensitivity to light or sound.  Denies any focal weakness, numbness or tingling.  Also reports that she is having increased anxiety intermittently due to the upcoming anniversary of her mother's death.  She states she has never seen a psychiatrist to process her mother's death.  She denies any suicidal or homicidal ideations.  Denies any fever, chills, abdominal pain, nausea, vomiting, diarrhea, dysuria, hematuria, vaginal bleeding or vaginal discharge.    Nursing Notes were reviewed.    REVIEW OF SYSTEMS     Review of Systems   Constitutional:  Negative for chills and fever.   HENT:  Negative for ear pain and sore throat.    Eyes:  Negative for pain and visual disturbance.   Respiratory:  Negative for cough and shortness of breath.    Cardiovascular:  Negative for chest pain and palpitations.   Gastrointestinal:  Negative for abdominal pain and vomiting.    Genitourinary:  Negative for dysuria, hematuria, vaginal bleeding and vaginal discharge.   Musculoskeletal:  Negative for arthralgias and myalgias.   Skin:  Negative for rash and wound.   Neurological:  Positive for light-headedness and headaches. Negative for dizziness, syncope, speech difficulty and weakness.   Psychiatric/Behavioral:  Negative for behavioral problems, confusion and suicidal ideas. The patient is nervous/anxious (situational).    All other systems reviewed and are negative.         PAST MEDICAL HISTORY   No past medical history on file.      SURGICAL HISTORY     No past surgical history on file.      CURRENT MEDICATIONS       Discharge Medication List as of 02/25/2023  7:16 PM          ALLERGIES     Patient has no known allergies.    FAMILY HISTORY     No family history on file.       SOCIAL HISTORY       Social History     Socioeconomic History    Marital status: Single         PHYSICAL EXAM       ED Triage Vitals [02/25/23 1754]   BP Temp Temp Source Pulse Respirations SpO2 Height Weight - Scale   130/72 98.5 F (36.9 C) Oral 76 18  100 % 1.575 m (5\' 2" ) 87.1 kg (192 lb)     Physical Exam  Vitals and nursing note reviewed.   Constitutional:       Appearance: Normal appearance.   HENT:      Head: Normocephalic and atraumatic.      Nose: Nose normal.      Mouth/Throat:      Mouth: Mucous membranes are moist.   Eyes:      Extraocular Movements: Extraocular movements intact.      Conjunctiva/sclera: Conjunctivae normal.      Pupils: Pupils are equal, round, and reactive to light.   Cardiovascular:      Rate and Rhythm: Normal rate and regular rhythm.   Pulmonary:      Effort: Pulmonary effort is normal.      Breath sounds: Normal breath sounds.   Abdominal:      Palpations: Abdomen is soft.      Tenderness: There is no abdominal tenderness. There is no guarding or rebound.   Musculoskeletal:         General: Normal range of motion.      Cervical back: Normal range of motion and neck supple.    Skin:     General: Skin is warm and dry.   Neurological:      General: No focal deficit present.      Mental Status: She is alert and oriented to person, place, and time.      Cranial Nerves: No cranial nerve deficit.      Sensory: No sensory deficit.      Motor: No weakness.      Coordination: Coordination normal.      Gait: Gait normal.   Psychiatric:         Mood and Affect: Mood normal.         Behavior: Behavior normal.          DIAGNOSTIC RESULTS     LABS:  Recent Results (from the past 24 hour(s))   POC Pregnancy Urine Qual    Collection Time: 02/25/23 12:00 AM   Result Value Ref Range    HCG, Urine, POC Negative Negative    Lot Number [id number]     Positive QC Pass/Fail Acceptable     Negative QC Pass/Fail Acceptable    POC Pregnancy Urine Qual    Collection Time: 02/25/23  6:04 PM   Result Value Ref Range    Preg Test, Ur Negative Negative   POC Urinalysis, Chemistry    Collection Time: 02/25/23  6:10 PM   Result Value Ref Range    Appearance Clear Clear    Bilirubin, Urine, POC Negative Negative    Blood, UA POC Negative Negative    Color (UA POC) Yellow Yellow    Glucose, UA POC Negative Negative mg/dL    Ketones, Urine, POC Negative Negative mg/dL    Leukocytes, UA Small (A) Negative    Nitrate, UA POC Negative Negative    pH, Urine, POC 6.5 4.5 - 8.0    Protein, Urine, POC 30 (A) Negative    Specific Gravity, Urine, POC 1.025 1.003 - 1.035    UROBILIN U POC >=8.0 (A) >0.2 EU/dL   CBC with Auto Differential    Collection Time: 02/25/23  6:22 PM   Result Value Ref Range    WBC 7.4 3.8 - 10.6 x10e3/mcL    RBC 4.42 3.60 - 5.20 x10e6/mcL    Hemoglobin 11.0 (L) 11.5 - 15.7 g/dL    Hematocrit 34.0  34.0 - 47.0 %    MCV 76.9 (L) 81.0 - 99.0 fL    MCH 24.9 (L) 27.0 - 34.5 pg    MCHC 32.4 32.0 - 36.0 g/dL    RDW 15.2 11.0 - 16.0 %    Platelets 265 140 - 440 x10e3/mcL    MPV 11.5 7.2 - 13.2 fL    Neutrophils % 65.8 42.0 - 74.0 %    Lymphocytes 25.3 15.0 - 45.0 %    Monocytes 7.7 4.0 - 12.0 %    Eosinophils % 0.8  0.0 - 7.0 %    Basophils % 0.4 0.0 - 2.0 %    Neutrophils Absolute 4.9 1.6 - 7.3 x10e3/mcL    Absolute Lymph # 1.9 1.0 - 3.2 x10e3/mcL    Absolute Mono # 0.6 0.3 - 1.0 x10e3/mcL    Absolute Eos # 0.1 0.0 - 0.5 x10e3/mcL    Absolute Baso # 0.0 0.0 - 0.2 x10e3/mcL    Immature Granulocytes 0.0 0.0 - 0.6 %    Immature Grans (Abs) 0.00 0.00 - 0.06 x10e3/mcL   Comprehensive Metabolic Panel    Collection Time: 02/25/23  6:22 PM   Result Value Ref Range    Sodium 136 135 - 145 mmol/L    Potassium 3.8 3.5 - 5.3 mmol/L    Chloride 106 98 - 107 mmol/L    CO2 22 22 - 29 mmol/L    Glucose 123 (H) 70 - 99 mg/dL    BUN 9 6 - 20 mg/dL    Creatinine 0.7 0.5 - 1.0 mg/dL    Anion Gap 9 2 - 17 mmol/L    OSMOLALITY CALCULATED 272 270 - 287 mOsm/kg    Calcium 8.7 8.5 - 10.7 mg/dL    Total Protein 7.7 5.7 - 8.3 g/dL    Albumin 3.7 3.5 - 5.2 g/dL    Globulin 4.0 1.9 - 4.4 g/dL    Albumin/Globulin Ratio 0.90 (L) 1.00 - 2.70    Total Bilirubin 0.32 0.00 - 1.20 mg/dL    Alk Phosphatase 69 35 - 117 unit/L    AST 30 0 - 35 unit/L    ALT 38 (H) 0 - 35 unit/L    Est, Glom Filt Rate 124 >=60 mL/min/1.70m   Lipase    Collection Time: 02/25/23  6:22 PM   Result Value Ref Range    Lipase 26 13 - 60 unit/L        EMERGENCY DEPARTMENT COURSE and DIFFERENTIAL DIAGNOSIS/MDM:   Vitals:    Vitals:    02/25/23 1754   BP: 130/72   Pulse: 76   Resp: 18   Temp: 98.5 F (36.9 C)   TempSrc: Oral   SpO2: 100%   Weight: 87.1 kg (192 lb)   Height: 1.575 m (5\' 2" )       Medical Decision Making  Presents for intermittent lightheadedness  Also states that she has had intermittent anxiety due to the upcoming anniversary of her mother's death  Normal vital signs here in ED  Patient with normal neuroexam  Patient's history sounds like she has got a lot of stress going on  CBC unremarkable  Chemistries unremarkable  Lipase within normal limits  Urine hCG negative  Urinalysis with small leukocytes.  Patient denies any dysuria.  Do not believe this is UTI at this  time.  Hydrated with 1 L normal saline.  Medicated with Toradol.  On reassessment resting comfortably.  Discussed importance of eating small meals throughout the day as well as  oral hydration.  Patient verbalized understanding  Also discussed her following up with psychiatry to help process her mother's death.  She states she will reach out to psychiatry for an appointment  She denies any suicidal or homicidal ideations.  She states she lives with her children and her father.  At this time stable for outpatient follow-up.  Return ED precautions given.      Amount and/or Complexity of Data Reviewed  Labs: ordered. Decision-making details documented in ED Course.    Risk  Prescription drug management.           REASSESSMENT     ED Course as of 02/25/23 1931   Sat Feb 25, 2023   1815 Pregnancy, Urine: Negative [AC]   1902 Leukocytes, UA(!): Small  Denies dysuria [AC]   1902 Resting comfortably.  Discussed reassuring workup here in ED. [AC]      ED Course User Index  [AC] Laia Wiley Lyn, PA-C       FINAL IMPRESSION      1. Dehydration    2. Stress at work    3. Anxiety state          DISPOSITION/PLAN   DISPOSITION Decision To Discharge 02/25/2023 07:03:16 PM      PATIENT REFERRED TO:  Rolinda Roan, MD  [id number]  [place] [address]  [phone]    Schedule an appointment as soon as possible for a visit   psychiatry for anxiety    [facility]  [place]  [phone]  Schedule an appointment as soon as possible for a visit         DISCHARGE MEDICATIONS:  Discharge Medication List as of 02/25/2023  7:16 PM        START taking these medications    Details   ondansetron (ZOFRAN-ODT) 4 MG disintegrating tablet Take 1 tablet by mouth 4 times daily as needed for Nausea or Vomiting, Disp-20 tablet, R-0Print               (Please note that portions of this note were completed with a voice recognition program.  Efforts were made to edit the  dictations but occasionally words are mis-transcribed.)    Levie Heritage, PA-C (electronically signed)  Emergency Physician Assistant           Levie Heritage, PA-C  02/25/23 1931

## 2023-08-29 ENCOUNTER — Inpatient Hospital Stay: Admit: 2023-08-29 | Discharge: 2023-08-29 | Disposition: A | Discharge status: Home / Self Care | Payer: MEDICAID

## 2023-08-29 ENCOUNTER — Inpatient Hospital Stay
Admit: 2023-08-29 | Discharge: 2023-08-29 | Disposition: A | Discharge status: Other Acute Inpatient Hospital | Payer: MEDICAID

## 2023-08-29 DIAGNOSIS — R109 Unspecified abdominal pain: Secondary | ICD-10-CM

## 2023-08-29 DIAGNOSIS — O26891 Other specified pregnancy related conditions, first trimester: Secondary | ICD-10-CM

## 2023-08-29 LAB — BASIC METABOLIC PANEL
Anion Gap: 9 mmol/L (ref 2–17)
BUN: 9 mg/dL (ref 6–20)
CO2: 23 mmol/L (ref 22–29)
Calcium: 9.3 mg/dL (ref 8.5–10.7)
Chloride: 105 mmol/L (ref 98–107)
Creatinine: 0.7 mg/dL (ref 0.5–1.0)
Est, Glom Filt Rate: 124 mL/min/1.73mÂ² (ref >=60)
Glucose: 101 mg/dL — ABNORMAL HIGH (ref 70–99)
Osmolaliy Calculated: 273 mosm/kg (ref 270–287)
Potassium: 3.6 mmol/L (ref 3.5–5.3)
Sodium: 137 mmol/L (ref 135–145)

## 2023-08-29 LAB — CBC
Hematocrit: 33.9 % — ABNORMAL LOW (ref 34.0–47.0)
Hemoglobin: 11.1 g/dL — ABNORMAL LOW (ref 11.5–15.7)
MCH: 25.1 pg — ABNORMAL LOW (ref 27.0–34.5)
MCHC: 32.7 g/dL (ref 30.0–36.0)
MCV: 76.7 fL — ABNORMAL LOW (ref 81.0–99.0)
MPV: 11.4 fL (ref 7.0–12.2)
Platelets: 267 10*3/uL (ref 140–440)
RBC: 4.42 x10e6/mcL (ref 3.60–5.20)
RDW: 14.2 % (ref 10.0–17.0)
WBC: 8.3 10*3/uL (ref 3.8–10.6)

## 2023-08-29 LAB — HCG, QUANTITATIVE, PREGNANCY: hCG Quant: 50494 m[IU]/mL — ABNORMAL HIGH (ref 0.0–5.3)

## 2023-08-29 LAB — POC URINALYSIS, CHEMISTRY
Blood, UA POC: NEGATIVE
Glucose, UA POC: NEGATIVE mg/dL
Nitrate, UA POC: NEGATIVE
Protein, Urine, POC: 30 — AB
Specific Gravity, Urine, POC: 1.025 (ref 1.003–1.035)
Urine Urobilinogen: 4 EU/dL — AB (ref >0.2)
pH, Urine, POC: 5.5 (ref 4.5–8.0)

## 2023-08-29 LAB — POC PREGNANCY UR-QUAL: Preg Test, Ur: POSITIVE — AB

## 2023-08-29 MED ORDER — ACETAMINOPHEN 500 MG PO TABS
500 | Freq: Once | ORAL | Status: AC
Start: 2023-08-29 — End: 2023-08-29

## 2023-08-29 MED ORDER — SODIUM CHLORIDE 0.9 % IV BOLUS
0.9 | Freq: Once | INTRAVENOUS | Status: AC
Start: 2023-08-29 — End: 2023-08-29

## 2023-08-29 MED ADMIN — acetaminophen (TYLENOL) tablet 1,000 mg: 1000 mg | ORAL | @ 18:00:00 | NDC 50580045711

## 2023-08-29 MED ADMIN — sodium chloride 0.9 % bolus 1,000 mL: 1000 mL | INTRAVENOUS | @ 18:00:00 | NDC 00338004904

## 2023-08-29 MED FILL — TYLENOL EXTRA STRENGTH 500 MG PO TABS: 500 MG | ORAL | Qty: 2

## 2023-08-29 NOTE — Discharge Instructions (Addendum)
 Please go directly to emergency department at [facility] ER as discussed to obtain an ultrasound to rule out possible ectopic pregnancy.  Please use warm compresses and Tylenol  as needed for management of the stye to your left eye.  If anything worsens or changes please return

## 2023-08-29 NOTE — ED Provider Notes (Signed)
 [facility]  EMERGENCY DEPARTMENT ENCOUNTER      Pt Name: Wendy Gentry  MRN: [id number]  Birthdate May 04, 1996  Date of evaluation: 08/29/2023  Provider: Wilbert LITTIE Mood, PA    CHIEF COMPLAINT       Chief Complaint   Patient presents with    Eye Pain     Reports left eye pain and swelling. Also complains of abd cramping and is pregnant. LMP 07/07/23. Reports positive test maybe the beginning of September. Reports has not contacted her doctor because she is going to get an abortion      HISTORY OF PRESENT ILLNESS    Wendy Gentry is a 27 y.o. female who presents to the emergency department for eval of upper eye swelling and pain.   She states that this began 2 days ago and has steadily worsened ever since then.  No redness to the whites of her eyes and no discharge or vision changes.  States the swelling is located to the lash line area.  No fevers or chills or upper respiratory symptoms.  Does not wear contacts or glasses.  Vision is grossly unchanged.    Of note the patient is pregnant with her last menstrual period July 07, 2023.  She had positive pregnancy test at home.  She has not had any prenatal care as she plans to terminate this pregnancy.  She does not have any vaginal bleeding or discharge but she states she is having some sharp and shooting planes to her lower abdomen area over the past 2 days.  No nausea or vomiting.  Denies urinary symptoms.    Nursing Notes were reviewed.  REVIEW OF SYSTEMS       ROS as documented in HPI unless otherwise stated below.          PHYSICAL EXAM       ED Triage Vitals [08/29/23 1144]   BP Systolic BP Percentile Diastolic BP Percentile Temp Temp Source Pulse Respirations SpO2   122/69 -- -- 97.5 F (36.4 C) Oral 95 18 100 %      Height Weight - Scale         1.575 m (5' 2) 86.2 kg (190 lb)             General: Alert, NAD  Head: NCAT  Eye: Normal conjunctiva, left upper eyelid along the lash line with area of swelling and tenderness, no purulence or whitehead,  no erythema or evidence of cellulitis, sclera is white and no erythema to the conjunctiva or drainage is noted  Neck: Supple, nontender  Cardiovascular: RRR, 2+ radial pulses  Respiratory: Normal WOB  Gastrointestinal: Soft, NT/ND, mild lower abdominal tenderness to palpation without rebound or guarding.  Musculoskeletal: No gross deformities  Neurologic: Alert, normal speech, moves all extremities  Skin: No rashes, no skin breakdown  Psychiatric: Cooperative with appropriate mood and affect    DIAGNOSTIC RESULTS   PROCEDURES:  Unless otherwise noted below, none     Procedures  RADIOLOGY:   Non-plain film images such as CT, Ultrasound and MRI are read by the radiologist. Plain radiographic images are visualized and preliminarily interpreted by the emergency physician with the below findings:    Interpretation per the Radiologist below, if available at the time of this note:    No orders to display       LABS:  Labs Reviewed   BASIC METABOLIC PANEL - Abnormal; Notable for the following components:       Result Value  Glucose 101 (*)     All other components within normal limits   CBC - Abnormal; Notable for the following components:    Hemoglobin 11.1 (*)     Hematocrit 33.9 (*)     MCV 76.7 (*)     MCH 25.1 (*)     All other components within normal limits   HCG, QUANTITATIVE, PREGNANCY - Abnormal; Notable for the following components:    hCG Quant 50,494.0 (*)     All other components within normal limits   POC URINALYSIS, CHEMISTRY - Abnormal; Notable for the following components:    Bilirubin, Urine, POC Small (*)     Ketones, Urine, POC Trace (*)     Leukocytes, UA Small (*)     Protein, Urine, POC 30 (*)     Urine Urobilinogen 4.0 (*)     All other components within normal limits   POC PREGNANCY UR-QUAL - Abnormal; Notable for the following components:    Preg Test, Ur Positive (*)     All other components within normal limits   POC PREGNANCY UR-QUAL       All other labs were within normal range or not  returned as of this dictation.  EMERGENCY DEPARTMENT COURSE/REASSESSMENT and MDM:   Vitals:    Vitals:    08/29/23 1144   BP: 122/69   Pulse: 95   Resp: 18   Temp: 97.5 F (36.4 C)   TempSrc: Oral   SpO2: 100%   Weight: 86.2 kg (190 lb)   Height: 1.575 m (5' 2)       ED Course:       Medical Decision Making  Discussed with patient that her physical exam findings are consistent with stye of the upper left eyelid area and I recommend Tylenol  as needed as well as warm compresses to the area.  She is endorsing lower abdominal pain and cramping in the setting of positive pregnancy test as well as hCG of 50,000 here today.  Not having any vaginal bleeding or discharge and denies urinary symptoms.  She has not had any prenatal care or evaluation for this pregnancy as of yet so I discussed with her my concerns for possible ectopic pregnancy and that she needs to have an ultrasound obtained emergently to rule this out.  Unfortunately we are at a facility that does not have ultrasound capability so we will transfer her to [facility] emergency room where transvaginal ultrasound can be obtained.  I called and spoke with Dr. Verplancken to provide him history and he is agreeable to excepting this patient for transfer and further evaluation.  Discussed with the patient is important that she drive directly from here straight to the emergency department for further evaluation and she is agreeable to this and understanding.    Amount and/or Complexity of Data Reviewed  Labs: ordered.    Risk  OTC drugs.  Prescription drug management.          FINAL IMPRESSION      1. Abdominal pain during pregnancy in first trimester    2. Hordeolum externum of left upper eyelid          PATIENT REFERRED TO:  [facility]  [address] [place], [place] [address]  Go to           DISCHARGE MEDICATIONS:  There are no discharge medications for this patient.      DISPOSITION/PLAN   DISPOSITION Decision To Transfer 08/29/2023 02:22:10 PM  Condition at  Disposition:  Stable      PAST MEDICAL HISTORY   No past medical history on file.    SURGICAL HISTORY     No past surgical history on file.  CURRENT MEDICATIONS       There are no discharge medications for this patient.    ALLERGIES     Patient has no known allergies.  FAMILY HISTORY     No family history on file.     SOCIAL HISTORY       Social History     Socioeconomic History    Marital status: Single             (Please note that portions of this note were completed with a voice recognition program.  Efforts were made to edit the dictations but occasionally words are mis-transcribed.)  Wilbert LITTIE Mood, PA (electronically signed)  Attending Emergency Physician       Mood Wilbert LITTIE, PA  08/29/23 1736
